# Patient Record
Sex: Female | Born: 1975 | Race: Black or African American | Hispanic: No
Health system: Western US, Academic
[De-identification: ages and names within clinical notes are randomized; demographics above are authoritative.]

## PROBLEM LIST (undated history)

## (undated) DIAGNOSIS — N901 Moderate vulvar dysplasia: Secondary | ICD-10-CM

## (undated) DIAGNOSIS — E1022 Type 1 diabetes mellitus with diabetic chronic kidney disease: Secondary | ICD-10-CM

## (undated) DIAGNOSIS — N289 Disorder of kidney and ureter, unspecified: Secondary | ICD-10-CM

## (undated) DIAGNOSIS — N939 Abnormal uterine and vaginal bleeding, unspecified: Secondary | ICD-10-CM

## (undated) DIAGNOSIS — G35 Multiple sclerosis: Secondary | ICD-10-CM

## (undated) DIAGNOSIS — I10 Essential (primary) hypertension: Secondary | ICD-10-CM

## (undated) DIAGNOSIS — E7800 Pure hypercholesterolemia, unspecified: Secondary | ICD-10-CM

## (undated) DIAGNOSIS — I509 Heart failure, unspecified: Secondary | ICD-10-CM

## (undated) DIAGNOSIS — E119 Type 2 diabetes mellitus without complications: Secondary | ICD-10-CM

## (undated) DIAGNOSIS — N189 Chronic kidney disease, unspecified: Secondary | ICD-10-CM

## (undated) DIAGNOSIS — D649 Anemia, unspecified: Secondary | ICD-10-CM

## (undated) HISTORY — DX: Abnormal uterine and vaginal bleeding, unspecified: N93.9

## (undated) HISTORY — DX: Multiple sclerosis: G35

## (undated) HISTORY — DX: Type 1 diabetes mellitus with diabetic chronic kidney disease: E10.22

## (undated) HISTORY — PX: SURGICAL HX OTHER: 99

## (undated) HISTORY — DX: Disorder of kidney and ureter, unspecified: N28.9

## (undated) HISTORY — DX: Moderate vulvar dysplasia: N90.1

---

## 2002-09-13 ENCOUNTER — Ambulatory Visit: Payer: Self-pay

## 2003-10-30 ENCOUNTER — Ambulatory Visit (EMERGENCY_DEPARTMENT_HOSPITAL): Payer: Self-pay

## 2004-03-04 ENCOUNTER — Ambulatory Visit: Payer: Self-pay

## 2004-03-06 ENCOUNTER — Ambulatory Visit: Payer: Self-pay

## 2004-03-10 ENCOUNTER — Ambulatory Visit: Payer: Self-pay

## 2004-03-19 ENCOUNTER — Encounter: Payer: Self-pay | Admitting: Family Medicine

## 2006-09-03 ENCOUNTER — Ambulatory Visit: Payer: Self-pay | Admitting: Psychiatry

## 2006-09-03 ENCOUNTER — Inpatient Hospital Stay (HOSPITAL_COMMUNITY): Admission: AD | Admit: 2006-09-03 | Discharge: 2006-09-07 | Payer: Self-pay | Admitting: Psychiatry

## 2015-04-11 HISTORY — PX: SURGICAL HX OTHER: 99

## 2016-08-19 NOTE — Progress Notes (Signed)
[place]  NEW GYNECOLOGY VISIT    ID/CHIEF COMPLAINT:  Desiree Eaton is a 40 year old G102P1 F who presents for abnormal uterine bleeding.   Patient was referred by Dr. Arta Silence.  Interpreter not needed for this visit.     HISTORY OF PRESENT ILLNESS  Review of outside records:   Initially seen by Dr. Truett Perna on 06/25/16 and reported AUB with heavy and irregular cycles. Per patient, had a normal [place] and Mirena IUD placed 05/2015 that helped with menses but still irregular and heavy. At her most recent visit on 08/11/16, she was started on Provera taper with plan for pelvic US and follow up. She has received IV iron and a recent blood transfusion. Mirena IUD in place on exam in clinic and recent GCCT negative. She desires a pregnancy again in the future.     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 Lenor Coffin. Was planning a course of Aldara post op.   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 Had not been told about the most recent [place]. No significant cramping.   [facility] noticed that last month and the month before she had this heavy, prolonged bleeding. It ultimately stopped though.   -So this is the second month that she has been bleeding continuously without  really stopping. Hasn't stopped bleeding since mid September. Feels like this was a sudden change in her bleeding starting about 2 months ago.   -Bleeding quantity: Changing pads every 3 hours (not soaked), Thinks it would take 5-6 hours to soak a pad. This is much improved with the Provera medication she started. Currently taking 1 pill (confirmed this is '10mg'$  with pill bottle today) 3x daily   -Prior to this, reports periods were normal and regular. No problems with heavy bleeding or severe cramping.  -Recent blood transfusion 2 weeks ago, she reports this was because of her dialysis and not the bleeding.   -Currently doing daily dialysis at home, has labs drawn frequently for this reason.   -Feels like it isn't safe for her to have pregnancies in the future because of her MS. Because of this, she is considering giving up the idea of a future pregnancy.     No lightheadedness or dizziness today.   Just tired of having bleeding even if it is slowing down.      Past Gyn History:   No LMP recorded.  Menarche Age: Not discussed   Menses: See above.   Sexually Active: Not currently. Last SA in March 2017. Has been with female partners.   Contraception: Mirena IUD (see above)   STIs: History of trichomonas (02/2015) Gonorrhea  and Chlamydia "awhile ago" before her son who was born 6y ago  Pap Smears: Last 02/2015 NILM HPV neg, Reports no history of abnormal paps.   Gyn surgeries: CS, WLE   Gardasil: Not discussed       OB HISTORY   2011 CS @ [facility]       ROS: Extended 2-9, Complete 10+   General: Negative    Eyes: Negative    Head, Ears, Throat: Negative    Cardiovascular: Negative    Respiratory: Negative    Breast: Negative   Gastrointestinal: Negative   Genitourinary: As noted in HPI above   Musculoskeletal: Negative    Skin: Negative    Neurological: Negative    Hematologic: As noted in HPI above   Psychiatric: Negative    Endocrine: Negative     Patient Active Problem List    Diagnosis Date Noted   . 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 Multiple sclerosis ([place]) [G35]        PAST MEDICAL HISTORY   Past Medical History:   Diagnosis Date   . Abnormal uterine bleeding    . Kidney disease    . Multiple sclerosis ([place])    . Type 1 DM with end-stage renal disease ([place])    . VIN II (vulvar intraepithelial neoplasia II)        SURGICAL HISTORY   Past Surgical History:   Procedure Laterality Date   . CESAREAN DELIVERY ONLY     . kidney dyalisis     . WLE and CO2 laser  04/2015       MEDICATIONS     Current Outpatient Prescriptions:   .  Acetaminophen 325 MG Oral Tab, Take 650 mg by mouth., Disp: , Rfl:   .  Aspirin 81 MG Oral Chew Tab, Chew and swallow 81 mg by mouth., Disp: , Rfl:   .  Interferon Beta-1b (BETASERON) 0.3 MG Subcutaneous Kit, Inject under the skin every other day., Disp: , Rfl:   .  Levonorgestrel (Mirena) 52 mg Intrauterine Device, Insert 1 Intra Uterine Device into the uterus One time., Disp: , Rfl:   .  Ondansetron 4 MG Oral TABLET DISPERSIBLE, Dissolve 4 mg on top of tongue and swallow., Disp: , Rfl:   Calcium Acetate and Calcitriol    Provera   Tizanidine     Review of patient's allergies indicates:  Allergies   Allergen Reactions   . Codeine        FAMILY HISTORY   Family History   Problem Relation Age of Onset   . No Known Problems Mother    . Breast Cancer No Hx Of    . Ovarian Cancer No Hx Of        SOCIAL HISTORY   Social History     Social History   . Marital status: Single     Spouse name: N/A   . Number of children: N/A   . Years of education: N/A     Occupational History   . Not on file.     Social History Main Topics   . Smoking status: Current Every Day Smoker   . Smokeless tobacco: Not on file   . Alcohol use No   . Drug use:      Types: Marijuana   . Sexual activity: Not Currently     Birth control/ protection: LNG IUD     Other Topics Concern   . Not  on file     Social History Narrative   . No narrative on file       Physical Exam:  1995   Detailed - 5-7 systems,  Comprehensive -8+  BP 148/86   Pulse 93   Ht '5\' 3"'$  (1.6 m)   Wt 180 lb (81.6 kg)   BMI 31.89 kg/m   General: healthy, alert, no distress.  Cardiac: Normal pulses in the lower extremities with normal capillary refill, No edema or varicosities.  Respiratory: Normal respiratory effort and chest wall movement with respiration.   Abdomen: Soft, non-tender. BS normal. No masses or organomegaly.   Psychiatric:   Mood/affect:  Normal.  Orientation: oriented to time, person and place  Neurologic:  Gait:  Normal.  Skin: Skin color, texture, turgor normal. No rashes or concerning lesions on visible areas.  Nodes: Inguinal areas: No adenopathy.  Pelvic Exam: External genitalia normal architecture, normal bartholin/skene/urethral meatus/anus, Vagina is rugated and well-estrogenized, small amount of blood in vaginal vault, cervix nulliparous and normal in appearance with minimal blood from os, tip of IUD noted at external os with long strings, IUD palpable on bimanual, no CMT, no bladder tenderness, uterus normal size, shape, and consistency, no adnexal masses or tenderness.     Bedside TVUS: Confirmed IUD in cervix.     Office Visit on 08/20/16   1. URINE PREGNANCY TEST HCG, ONSITE   Result Value Ref Range    Pregnancy (HCG) ([facility]), URN Negative     INTERNAL CONTROL Control Verified    2. 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     # Abnormal Uterine Bleeding: She describes irregular and intermittently heavy menses for approximately the past 2 months, previously fairly well controlled with Mirena IUD.  No significant cramping but outside ultrasound documented IUD within the cervix which was confirmed on bedside ultrasound today.   Reviewed that this may be the reason for abnormal bleeding and discussed replacing the IUD today while continuing her on her Provera taper which has significantly slowed her bleeding over the past 2 weeks.     No other structural causes for AUB noted on ultrasound including polyps or fibroids.  Endometrial stripe not particularly thickened and bleeding has slowed with Provera so do not think a procedure such as a D&C is warranted at this time. She does have a slightly enlarged uterus which may be a component of adenomyosis.  We also discussed the possibility of precancer or cancer and chronic anovulation given her multiple medical comorbidities.  Her recent prolactin and thyroid testing was normal outside as above.     -Continue the Provera taper with 10 mg 3 times a day.  Discussed that she will likely need this through the first several months of the Mirena IUD.  -Mirena IUD removed and replaced today due to malposition.    -EMB done today to rule out hyperplasia or neoplasia   -Deferred lab work given her recent testing as above and reported nearly daily blood work given she is on dialysis   -Also reviewed surgical options for management given that she is contemplating future fertility.  This may include endometrial ablation versus  hysterectomy although suspect that she is currently not well enough to undergo surgery if bleeding can be controlled with medical management.     # History of VIN2: Patient did not recall WLE or a history of VIN2 during our visit. Records seen in Care Everywhere. Will need vulvar exams with colposcopy when AUB has been resolved.     # Routine GYN:   -Paps: UTD  -STI screening: GCCT done today     Note forwarded to referring provider.         PROCEDURE NOTE: ENDOMETRIAL BIOPSY & INTRAUTERINE DEVICE INSERTION    A visit for counseling and discussion of this procedure took place today. Testing for chlamydia and gonorrhea was done today and result was pending. Pap smear was not done. A  urine pregnancy test was done and the result was negative.     Mirena  Lot number: [id number]  Exp 04/20    Indication:  Desire for contraception and Abnormal Uterine Bleeding   Allergic to anesthetic, latex, iodine: NO    Review of contraindications:    Known or suspected pregnancy: NO    Distorted uterine cavity (cannot accommodate an IUD): NO    Current breast cancer (Levonorgestrel IUD only): NO     Cervical cancer, awaiting treatment: NO    Endometrial cancer: NO    Current pelvic infection: NO    Unexplained abnormal uterine bleeding: NO    History of Wilson's disease (Copper IUD only): NO   In compliance with federal regulations, the patient was given the informational brochure that accompanies the IUD: YES  The risks and benefits of use of the IUD were discussed with the patient: YES    Risks include, but are not limited to:   1. The remote chance of contraceptive failure   2. Increased risk of ectopic pregnancy, infection, and/or miscarriage if pregnancy occurs   3. Risk of PID if exposed to sexually transmitted diseases   4. Risk of heavier periods and dysmenorrhea (Copper IUD only)    5. Risk of irregular periods and amenorrhea (Levonorgestrel IUD only)    6. Risk of embedment of the IUD into the endometrium and consequent difficulty in removal   7. Risk of uterine perforation, possible damage to intraabdominal organs and/or need for surgical intervention.       Correct patient NAME and ID YES   Correct PROCEDURE YES   Correct EQUIPMENT and SETTINGS YES   Completed CONSENT YES, Date: 08/20/2016     The patient was placed in the dorsal lithotomy position.  Bimanual exam showed the uterus to be in the neutral position and mildly enlarged.  Speculum placed in vagina with findings as above and malpositioned IUD visualized at the external cervical os.  The IUD was grasped and removed in its entirety.  The removal was slightly difficult suggesting a possible embedment of the IUD arm(s).  The cervix was then  cleansed with Betadine x3. A single tooth tenaculum was applied to the anterior lip of the cervix. Endometrial biopsy pipelle introduced into uterus to depth of 10cm and specimen obtained in single pass. Scant EBL. Patient tolerated that portion of the procedure with minimal discomfort.  A Mirena intrauterine device was inserted to the fundus using the prepackaged inserter.  The IUD strings were trimmed to a length of 3 cm from the cervical os.    The patient tolerated the procedure well.  Complications: none  Procedure performed by Attending physician    Postprocedure education was performed, including  the following points:  1. You can manually check your IUD strings and were taught how to do this, but this is not required.   2. If you are concerned you are pregnant, call your doctor.   3. Seek immediate care for signs of infection, including pelvic pain, vaginal discharge or excessive or intermenstrual bleeding, and fever.   4. If you have heavy cramping and are concerned that your IUD has been expelled, please call your provider.   5. The MIRENA  IUD should be removed 5-7 years after insertion (past 5 years discussed with patient that it was off-label use).   6. It is still necessary to return for periodic check-ups, pap smears, etc at a time interval recommended by your provider.    Patient instructed to return for follow-up after next menses (4-6 weeks).  She may choose to follow-up closer to home given [place].

## 2016-08-20 ENCOUNTER — Encounter (HOSPITAL_BASED_OUTPATIENT_CLINIC_OR_DEPARTMENT_OTHER): Payer: Self-pay | Admitting: Obstetrics & Gynecology

## 2016-08-20 ENCOUNTER — Ambulatory Visit: Payer: Medicare Other | Attending: Obstetrics & Gynecology | Admitting: Obstetrics & Gynecology

## 2016-08-20 VITALS — BP 148/86 | HR 93 | Ht 63.0 in | Wt 180.0 lb

## 2016-08-20 DIAGNOSIS — T8332XA Displacement of intrauterine contraceptive device, initial encounter: Secondary | ICD-10-CM | POA: Insufficient documentation

## 2016-08-20 DIAGNOSIS — N939 Abnormal uterine and vaginal bleeding, unspecified: Secondary | ICD-10-CM

## 2016-08-20 DIAGNOSIS — Z6831 Body mass index (BMI) 31.0-31.9, adult: Secondary | ICD-10-CM

## 2016-08-20 DIAGNOSIS — Z30431 Encounter for routine checking of intrauterine contraceptive device: Secondary | ICD-10-CM | POA: Insufficient documentation

## 2016-08-20 DIAGNOSIS — G35 Multiple sclerosis: Secondary | ICD-10-CM | POA: Insufficient documentation

## 2016-08-20 DIAGNOSIS — Z30430 Encounter for insertion of intrauterine contraceptive device: Secondary | ICD-10-CM

## 2016-08-20 DIAGNOSIS — N901 Moderate vulvar dysplasia: Secondary | ICD-10-CM | POA: Insufficient documentation

## 2016-08-20 DIAGNOSIS — N186 End stage renal disease: Secondary | ICD-10-CM | POA: Insufficient documentation

## 2016-08-20 DIAGNOSIS — E1022 Type 1 diabetes mellitus with diabetic chronic kidney disease: Secondary | ICD-10-CM | POA: Insufficient documentation

## 2016-08-20 LAB — PR URINE PREGNANCY TEST HCG, ONSITE: Pregnancy (HCG) (UWNC), URN: NEGATIVE

## 2016-08-20 NOTE — Patient Instructions (Signed)
After Your IUD Is Placed     What can I expect?  . It is normal to have unusual bleeding, spotting, and mild cramping for 3 to 6 months after your IUD is placed. These usually improve over time. If your bleeding or cramping is not getting better, or if you have heavy bleeding or strong pelvic pain, call our office right away.   . Ibuprofen (Advil, Motrin, and others) helps decrease pain and cramping. You can buy ibuprofen at any drugstore without a prescription. Take 3 tablets (200 mg each) by mouth every 6 hours as needed for pain and cramping. Do not take more than 12 tablets in 24 hours. Be sure to take ibuprofen with food.    What problems should I watch for?   . The IUD can sometimes fall out (be expelled from the uterus).   ? If you have heavy bleeding or pain, check with your fingers to make sure you can still feel the IUD strings. Do not pull on the strings, since you could remove the IUD. If you cannot feel the strings, call our office.   ? If you are not having symptoms that concern you, you do not need to check your IUD strings.   . The IUD is a very good form of birth control, but no form of birth control is perfect. If you have nausea, breast tenderness, pelvic pain, or unexpected bleeding, you may be pregnant. If you have these signs, call our office.  . Women who have an IUD can get pelvic infections. Call us right away if you have pain in your pelvis or lower abdomen, unusual vaginal discharge, or a fever higher than 101F (38.3C), if it is not caused by another illness.   . See your healthcare provider for yearly exams, and have all routine screening tests. An IUD does not protect against sexually transmitted infections.     When should the IUD be removed?  An IUD can be removed at any time. IUDs are approved by the Food and Drug Administration (FDA) for up to a certain number of years, but in some cases the IUD can be used for a longer period of time. It is important that you talk about this  with your healthcare provider.   Here are the number of years each IUD can be used:  . Mirena IUD: 5 to 7 years after it is placed (approved by the FDA for 5 years)  . Paragard IUD: 10 to 12 years after it is placed (approved by the FDA for 10 years)  . Skyla IUD: 3 years after it is placed (approved by the FDA for 3 years)  . Liletta IUD: 3 to 7 years after it is placed (approved by the FDA for 3 years)    When should I call my healthcare provider?  Call your healthcare provider if you:  . Have heavy bleeding  . Feel strong or sharp pain in your pelvis or lower abdomen  . Have vaginal discharge that smells bad  . Feel pain when you have sex  . Cannot find the IUD strings  . Have a fever above 101F (38.3C) that you cannot explain  . Think you might be pregnant  . Want to have the IUD removed    Questions?  . Keep these instructions so you can refer to them as needed.  . Please call your healthcare provider if you have any questions or concerns about your IUD.

## 2016-08-21 LAB — GC&CHLAM NUCLEIC ACID DETECTN
Chlam Trachomatis Nucleic Acid: NEGATIVE
N.Gonorrhoeae(GC) Nucleic Acid: NEGATIVE

## 2016-08-22 ENCOUNTER — Telehealth (HOSPITAL_BASED_OUTPATIENT_CLINIC_OR_DEPARTMENT_OTHER): Payer: Self-pay | Admitting: Obstetrics & Gynecology

## 2016-08-22 LAB — PATHOLOGY, SURGICAL

## 2016-08-22 NOTE — Telephone Encounter (Signed)
Asked by Dr. Durenda AgeAndersen to call patient:    Idalia NeedleAndersen, Melanie L, MD  [facility]            Please call Desiree Eaton (goes by TT) and update her that the endometrial biopsy was normal without any concerns for abnormal growths or cancerous tissue.      Called patient on her cell phone and got voice mail-left a non detailed message that her results are back on her testing and it was all normal and no worries-asked her to call me back to discuss or if she has questions.

## 2016-09-03 ENCOUNTER — Telehealth (HOSPITAL_BASED_OUTPATIENT_CLINIC_OR_DEPARTMENT_OTHER): Payer: Self-pay | Admitting: Obstetrics & Gynecology

## 2016-09-03 NOTE — Telephone Encounter (Signed)
(  TEXTING IS AN OPTION FOR [facility] CLINICS ONLY)  Is this a [facility] clinic? No      RETURN CALL: Detailed message on voicemail only      SUBJECT:  General Message     REASON FOR REQUEST: Megan states [place], Barrie Folk, MD is seeing patient right now and is requesting a copy of patients results from the endometrial biopsy patient had on 08/20/16. Please fax information to [phone]     MESSAGE:

## 2016-09-03 NOTE — Telephone Encounter (Signed)
Fwd to RN pool.

## 2016-09-03 NOTE — Telephone Encounter (Signed)
RN faxed Pathology results to Dr Harvie HeckHallak at  [phone].

## 2018-01-17 ENCOUNTER — Inpatient Hospital Stay: Payer: Self-pay

## 2018-03-09 ENCOUNTER — Inpatient Hospital Stay: Payer: Self-pay

## 2018-04-21 ENCOUNTER — Inpatient Hospital Stay: Payer: Self-pay

## 2018-04-30 ENCOUNTER — Inpatient Hospital Stay: Payer: Self-pay

## 2018-05-18 ENCOUNTER — Inpatient Hospital Stay: Payer: Self-pay

## 2018-07-15 ENCOUNTER — Inpatient Hospital Stay: Payer: Self-pay

## 2018-08-14 ENCOUNTER — Inpatient Hospital Stay: Payer: Self-pay

## 2018-09-28 ENCOUNTER — Inpatient Hospital Stay: Payer: Self-pay

## 2018-11-28 ENCOUNTER — Inpatient Hospital Stay: Payer: Self-pay

## 2019-09-02 DIAGNOSIS — N183 Chronic kidney disease, stage 3 unspecified: Secondary | ICD-10-CM

## 2019-09-02 DIAGNOSIS — M869 Osteomyelitis, unspecified: Secondary | ICD-10-CM

## 2019-09-02 DIAGNOSIS — N189 Chronic kidney disease, unspecified: Secondary | ICD-10-CM | POA: Diagnosis not present

## 2019-09-02 DIAGNOSIS — J9601 Acute respiratory failure with hypoxia: Secondary | ICD-10-CM | POA: Diagnosis not present

## 2019-09-02 DIAGNOSIS — I10 Essential (primary) hypertension: Secondary | ICD-10-CM | POA: Diagnosis not present

## 2019-09-02 DIAGNOSIS — I361 Nonrheumatic tricuspid (valve) insufficiency: Secondary | ICD-10-CM | POA: Diagnosis not present

## 2019-09-02 DIAGNOSIS — I509 Heart failure, unspecified: Secondary | ICD-10-CM

## 2019-09-03 ENCOUNTER — Inpatient Hospital Stay (HOSPITAL_COMMUNITY)
Admission: AD | Admit: 2019-09-03 | Discharge: 2019-09-07 | DRG: 291 | Disposition: A | Discharge status: Skilled Nursing Facility | Payer: Medicaid Other | Source: Other Acute Inpatient Hospital | Attending: Internal Medicine | Admitting: Internal Medicine

## 2019-09-03 ENCOUNTER — Inpatient Hospital Stay (HOSPITAL_COMMUNITY): Payer: Medicaid Other

## 2019-09-03 DIAGNOSIS — J9600 Acute respiratory failure, unspecified whether with hypoxia or hypercapnia: Secondary | ICD-10-CM | POA: Diagnosis present

## 2019-09-03 DIAGNOSIS — J9601 Acute respiratory failure with hypoxia: Secondary | ICD-10-CM | POA: Diagnosis present

## 2019-09-03 DIAGNOSIS — N179 Acute kidney failure, unspecified: Secondary | ICD-10-CM | POA: Diagnosis not present

## 2019-09-03 DIAGNOSIS — Z87891 Personal history of nicotine dependence: Secondary | ICD-10-CM

## 2019-09-03 DIAGNOSIS — E113599 Type 2 diabetes mellitus with proliferative diabetic retinopathy without macular edema, unspecified eye: Secondary | ICD-10-CM | POA: Diagnosis not present

## 2019-09-03 DIAGNOSIS — N183 Chronic kidney disease, stage 3 unspecified: Secondary | ICD-10-CM

## 2019-09-03 DIAGNOSIS — Z89412 Acquired absence of left great toe: Secondary | ICD-10-CM | POA: Diagnosis not present

## 2019-09-03 DIAGNOSIS — E1142 Type 2 diabetes mellitus with diabetic polyneuropathy: Secondary | ICD-10-CM | POA: Diagnosis present

## 2019-09-03 DIAGNOSIS — M869 Osteomyelitis, unspecified: Secondary | ICD-10-CM | POA: Diagnosis not present

## 2019-09-03 DIAGNOSIS — Z881 Allergy status to other antibiotic agents status: Secondary | ICD-10-CM | POA: Diagnosis not present

## 2019-09-03 DIAGNOSIS — Z9089 Acquired absence of other organs: Secondary | ICD-10-CM

## 2019-09-03 DIAGNOSIS — Z98891 History of uterine scar from previous surgery: Secondary | ICD-10-CM | POA: Diagnosis not present

## 2019-09-03 DIAGNOSIS — Z89411 Acquired absence of right great toe: Secondary | ICD-10-CM | POA: Diagnosis not present

## 2019-09-03 DIAGNOSIS — Z841 Family history of disorders of kidney and ureter: Secondary | ICD-10-CM

## 2019-09-03 DIAGNOSIS — Z8739 Personal history of other diseases of the musculoskeletal system and connective tissue: Secondary | ICD-10-CM

## 2019-09-03 DIAGNOSIS — L97429 Non-pressure chronic ulcer of left heel and midfoot with unspecified severity: Secondary | ICD-10-CM | POA: Diagnosis not present

## 2019-09-03 DIAGNOSIS — Z833 Family history of diabetes mellitus: Secondary | ICD-10-CM

## 2019-09-03 DIAGNOSIS — R0602 Shortness of breath: Secondary | ICD-10-CM | POA: Diagnosis present

## 2019-09-03 DIAGNOSIS — Z89422 Acquired absence of other left toe(s): Secondary | ICD-10-CM

## 2019-09-03 DIAGNOSIS — Z888 Allergy status to other drugs, medicaments and biological substances status: Secondary | ICD-10-CM

## 2019-09-03 DIAGNOSIS — K5900 Constipation, unspecified: Secondary | ICD-10-CM | POA: Diagnosis present

## 2019-09-03 DIAGNOSIS — I5033 Acute on chronic diastolic (congestive) heart failure: Secondary | ICD-10-CM | POA: Diagnosis not present

## 2019-09-03 DIAGNOSIS — E1122 Type 2 diabetes mellitus with diabetic chronic kidney disease: Secondary | ICD-10-CM | POA: Diagnosis present

## 2019-09-03 DIAGNOSIS — E1169 Type 2 diabetes mellitus with other specified complication: Secondary | ICD-10-CM | POA: Diagnosis not present

## 2019-09-03 DIAGNOSIS — E785 Hyperlipidemia, unspecified: Secondary | ICD-10-CM | POA: Diagnosis present

## 2019-09-03 DIAGNOSIS — N189 Chronic kidney disease, unspecified: Secondary | ICD-10-CM

## 2019-09-03 DIAGNOSIS — E11621 Type 2 diabetes mellitus with foot ulcer: Principal | ICD-10-CM | POA: Diagnosis present

## 2019-09-03 DIAGNOSIS — Z87442 Personal history of urinary calculi: Secondary | ICD-10-CM | POA: Diagnosis not present

## 2019-09-03 DIAGNOSIS — Z882 Allergy status to sulfonamides status: Secondary | ICD-10-CM

## 2019-09-03 DIAGNOSIS — Z20828 Contact with and (suspected) exposure to other viral communicable diseases: Secondary | ICD-10-CM | POA: Diagnosis present

## 2019-09-03 DIAGNOSIS — I10 Essential (primary) hypertension: Secondary | ICD-10-CM | POA: Diagnosis not present

## 2019-09-03 DIAGNOSIS — B952 Enterococcus as the cause of diseases classified elsewhere: Secondary | ICD-10-CM | POA: Diagnosis not present

## 2019-09-03 DIAGNOSIS — Z79899 Other long term (current) drug therapy: Secondary | ICD-10-CM

## 2019-09-03 DIAGNOSIS — I330 Acute and subacute infective endocarditis: Secondary | ICD-10-CM

## 2019-09-03 DIAGNOSIS — D631 Anemia in chronic kidney disease: Secondary | ICD-10-CM | POA: Diagnosis not present

## 2019-09-03 DIAGNOSIS — Z794 Long term (current) use of insulin: Secondary | ICD-10-CM

## 2019-09-03 DIAGNOSIS — Z95828 Presence of other vascular implants and grafts: Secondary | ICD-10-CM

## 2019-09-03 DIAGNOSIS — Z8249 Family history of ischemic heart disease and other diseases of the circulatory system: Secondary | ICD-10-CM

## 2019-09-03 DIAGNOSIS — R7881 Bacteremia: Secondary | ICD-10-CM | POA: Diagnosis present

## 2019-09-03 DIAGNOSIS — B9561 Methicillin susceptible Staphylococcus aureus infection as the cause of diseases classified elsewhere: Secondary | ICD-10-CM | POA: Diagnosis not present

## 2019-09-03 DIAGNOSIS — I130 Hypertensive heart and chronic kidney disease with heart failure and stage 1 through stage 4 chronic kidney disease, or unspecified chronic kidney disease: Principal | ICD-10-CM | POA: Diagnosis present

## 2019-09-03 DIAGNOSIS — B9689 Other specified bacterial agents as the cause of diseases classified elsewhere: Secondary | ICD-10-CM | POA: Diagnosis not present

## 2019-09-03 DIAGNOSIS — I5031 Acute diastolic (congestive) heart failure: Secondary | ICD-10-CM

## 2019-09-03 DIAGNOSIS — Z8619 Personal history of other infectious and parasitic diseases: Secondary | ICD-10-CM

## 2019-09-03 DIAGNOSIS — I5030 Unspecified diastolic (congestive) heart failure: Secondary | ICD-10-CM | POA: Diagnosis not present

## 2019-09-03 DIAGNOSIS — I38 Endocarditis, valve unspecified: Secondary | ICD-10-CM | POA: Diagnosis present

## 2019-09-03 DIAGNOSIS — L97529 Non-pressure chronic ulcer of other part of left foot with unspecified severity: Secondary | ICD-10-CM | POA: Diagnosis not present

## 2019-09-03 HISTORY — DX: Pure hypercholesterolemia, unspecified: E78.00

## 2019-09-03 HISTORY — DX: Chronic kidney disease, unspecified: N18.9

## 2019-09-03 HISTORY — DX: Essential (primary) hypertension: I10

## 2019-09-03 HISTORY — DX: Heart failure, unspecified: I50.9

## 2019-09-03 HISTORY — DX: Type 2 diabetes mellitus without complications: E11.9

## 2019-09-03 HISTORY — DX: Anemia, unspecified: D64.9

## 2019-09-03 LAB — CBC
HCT: 23.9 % — ABNORMAL LOW (ref 36.0–46.0)
Hemoglobin: 7.8 g/dL — ABNORMAL LOW (ref 12.0–15.0)
MCH: 26.7 pg (ref 26.0–34.0)
MCHC: 32.6 g/dL (ref 30.0–36.0)
MCV: 81.8 fL (ref 80.0–100.0)
Platelets: 282 10*3/uL (ref 150–400)
RBC: 2.92 MIL/uL — ABNORMAL LOW (ref 3.87–5.11)
RDW: 17.8 % — ABNORMAL HIGH (ref 11.5–15.5)
WBC: 14.1 10*3/uL — ABNORMAL HIGH (ref 4.0–10.5)
nRBC: 0 % (ref 0.0–0.2)

## 2019-09-03 LAB — COMPREHENSIVE METABOLIC PANEL
ALT: 13 U/L (ref 0–44)
AST: 16 U/L (ref 15–41)
Albumin: 2.7 g/dL — ABNORMAL LOW (ref 3.5–5.0)
Alkaline Phosphatase: 84 U/L (ref 38–126)
Anion gap: 12 (ref 5–15)
BUN: 38 mg/dL — ABNORMAL HIGH (ref 6–20)
CO2: 19 mmol/L — ABNORMAL LOW (ref 22–32)
Calcium: 8.5 mg/dL — ABNORMAL LOW (ref 8.9–10.3)
Chloride: 102 mmol/L (ref 98–111)
Creatinine, Ser: 2.5 mg/dL — ABNORMAL HIGH (ref 0.44–1.00)
GFR calc Af Amer: 27 mL/min — ABNORMAL LOW (ref >60)
GFR calc non Af Amer: 23 mL/min — ABNORMAL LOW (ref >60)
Glucose, Bld: 121 mg/dL — ABNORMAL HIGH (ref 70–99)
Potassium: 4.5 mmol/L (ref 3.5–5.1)
Sodium: 133 mmol/L — ABNORMAL LOW (ref 135–145)
Total Bilirubin: 0.4 mg/dL (ref 0.3–1.2)
Total Protein: 7.1 g/dL (ref 6.5–8.1)

## 2019-09-03 LAB — HEMOGLOBIN A1C
Hgb A1c MFr Bld: 7.3 % — ABNORMAL HIGH (ref 4.8–5.6)
Mean Plasma Glucose: 162.81 mg/dL

## 2019-09-03 LAB — GLUCOSE, CAPILLARY
Glucose-Capillary: 117 mg/dL — ABNORMAL HIGH (ref 70–99)
Glucose-Capillary: 177 mg/dL — ABNORMAL HIGH (ref 70–99)

## 2019-09-03 LAB — HIV ANTIBODY (ROUTINE TESTING W REFLEX): HIV Screen 4th Generation wRfx: NONREACTIVE

## 2019-09-03 MED ORDER — SODIUM CHLORIDE 0.9% FLUSH: 3.0000 mL | INTRAVENOUS | Status: DC | PRN

## 2019-09-03 MED ORDER — INSULIN ASPART 100 UNIT/ML ~~LOC~~ SOLN
0.0000 [IU] | Freq: Three times a day (TID) | SUBCUTANEOUS | Status: DC
  Administered 2019-09-04 (×2): 1 [IU] via SUBCUTANEOUS
  Administered 2019-09-05: 16:00:00 2 [IU] via SUBCUTANEOUS
  Administered 2019-09-06: 1 [IU] via SUBCUTANEOUS
  Administered 2019-09-06 – 2019-09-07 (×3): 2 [IU] via SUBCUTANEOUS

## 2019-09-03 MED ORDER — ONDANSETRON HCL 4 MG PO TABS: 4.0000 mg | ORAL_TABLET | Freq: Four times a day (QID) | ORAL | Status: DC | PRN

## 2019-09-03 MED ORDER — SODIUM CHLORIDE 0.9% FLUSH
10.0000 mL | Freq: Two times a day (BID) | INTRAVENOUS | Status: DC
  Administered 2019-09-04 – 2019-09-06 (×3): 10 mL

## 2019-09-03 MED ORDER — MAGNESIUM HYDROXIDE 400 MG/5ML PO SUSP: 30.0000 mL | Freq: Every day | ORAL | Status: DC | PRN

## 2019-09-03 MED ORDER — CARVEDILOL 25 MG PO TABS: 25.0000 mg | ORAL_TABLET | Freq: Two times a day (BID) | ORAL | Status: DC

## 2019-09-03 MED ORDER — INSULIN GLARGINE 100 UNIT/ML ~~LOC~~ SOLN
15.0000 [IU] | Freq: Every day | SUBCUTANEOUS | Status: DC
  Administered 2019-09-03 – 2019-09-06 (×4): 15 [IU] via SUBCUTANEOUS
  Filled 2019-09-03 (×5): qty 0.15

## 2019-09-03 MED ORDER — GABAPENTIN 400 MG PO CAPS
800.0000 mg | ORAL_CAPSULE | Freq: Three times a day (TID) | ORAL | Status: DC
  Administered 2019-09-03 – 2019-09-07 (×12): 800 mg via ORAL
  Filled 2019-09-03 (×12): qty 2

## 2019-09-03 MED ORDER — FERROUS SULFATE 325 (65 FE) MG PO TABS
325.0000 mg | ORAL_TABLET | Freq: Two times a day (BID) | ORAL | Status: DC
  Administered 2019-09-04 – 2019-09-07 (×8): 325 mg via ORAL
  Filled 2019-09-03 (×9): qty 1

## 2019-09-03 MED ORDER — DOCUSATE SODIUM 100 MG PO CAPS
100.0000 mg | ORAL_CAPSULE | Freq: Two times a day (BID) | ORAL | Status: DC
  Administered 2019-09-03 – 2019-09-07 (×8): 100 mg via ORAL
  Filled 2019-09-03 (×8): qty 1

## 2019-09-03 MED ORDER — PANTOPRAZOLE SODIUM 40 MG PO TBEC
40.0000 mg | DELAYED_RELEASE_TABLET | Freq: Two times a day (BID) | ORAL | Status: DC
  Administered 2019-09-03 – 2019-09-07 (×8): 40 mg via ORAL
  Filled 2019-09-03 (×8): qty 1

## 2019-09-03 MED ORDER — ASPIRIN EC 81 MG PO TBEC
81.0000 mg | DELAYED_RELEASE_TABLET | Freq: Every day | ORAL | Status: DC
  Administered 2019-09-03 – 2019-09-07 (×5): 81 mg via ORAL
  Filled 2019-09-03 (×4): qty 1

## 2019-09-03 MED ORDER — VANCOMYCIN VARIABLE DOSE PER UNSTABLE RENAL FUNCTION (PHARMACIST DOSING): Status: DC

## 2019-09-03 MED ORDER — PIPERACILLIN-TAZOBACTAM 3.375 G IVPB
3.3750 g | Freq: Three times a day (TID) | INTRAVENOUS | Status: DC
  Administered 2019-09-04 (×2): 3.375 g via INTRAVENOUS
  Filled 2019-09-03 (×3): qty 50

## 2019-09-03 MED ORDER — SENNA 8.6 MG PO TABS
1.0000 | ORAL_TABLET | Freq: Two times a day (BID) | ORAL | Status: DC
  Administered 2019-09-03 – 2019-09-07 (×8): 8.6 mg via ORAL
  Filled 2019-09-03 (×9): qty 1

## 2019-09-03 MED ORDER — ONDANSETRON HCL 4 MG/2ML IJ SOLN: 4.0000 mg | Freq: Four times a day (QID) | INTRAMUSCULAR | Status: DC | PRN

## 2019-09-03 MED ORDER — SODIUM CHLORIDE 0.9 % IV SOLN
250.0000 mL | INTRAVENOUS | Status: DC | PRN
  Administered 2019-09-05: 22:00:00 250 mL via INTRAVENOUS

## 2019-09-03 MED ORDER — PIPERACILLIN-TAZOBACTAM 3.375 G IVPB 30 MIN
3.3750 g | Freq: Once | INTRAVENOUS | Status: AC
  Administered 2019-09-03: 23:00:00 3.375 g via INTRAVENOUS
  Filled 2019-09-03: qty 50

## 2019-09-03 MED ORDER — AMLODIPINE BESYLATE 10 MG PO TABS
10.0000 mg | ORAL_TABLET | Freq: Every day | ORAL | Status: DC
  Administered 2019-09-04 – 2019-09-07 (×4): 10 mg via ORAL
  Filled 2019-09-03 (×4): qty 1

## 2019-09-03 MED ORDER — SODIUM CHLORIDE 0.9% FLUSH: 10.0000 mL | INTRAVENOUS | Status: DC | PRN

## 2019-09-03 MED ORDER — FUROSEMIDE 10 MG/ML IJ SOLN: 40.0000 mg | Freq: Every day | INTRAMUSCULAR | Status: DC

## 2019-09-03 MED ORDER — VANCOMYCIN HCL 10 G IV SOLR
1750.0000 mg | Freq: Once | INTRAVENOUS | Status: DC
  Administered 2019-09-03: 1750 mg via INTRAVENOUS
  Filled 2019-09-03: qty 1750

## 2019-09-03 MED ORDER — SODIUM CHLORIDE 0.9% FLUSH
3.0000 mL | Freq: Two times a day (BID) | INTRAVENOUS | Status: DC
  Administered 2019-09-04 – 2019-09-06 (×3): 3 mL via INTRAVENOUS

## 2019-09-03 MED ORDER — OXYCODONE HCL 5 MG PO TABS
5.0000 mg | ORAL_TABLET | Freq: Four times a day (QID) | ORAL | Status: DC | PRN
  Administered 2019-09-03 – 2019-09-07 (×12): 5 mg via ORAL
  Filled 2019-09-03 (×12): qty 1

## 2019-09-03 MED ORDER — CHLORHEXIDINE GLUCONATE CLOTH 2 % EX PADS
6.0000 | MEDICATED_PAD | Freq: Every day | CUTANEOUS | Status: DC
  Administered 2019-09-03 – 2019-09-07 (×4): 6 via TOPICAL

## 2019-09-03 MED ORDER — ACETAMINOPHEN 325 MG PO TABS: 650.0000 mg | ORAL_TABLET | Freq: Four times a day (QID) | ORAL | Status: DC | PRN

## 2019-09-03 MED ORDER — HEPARIN SODIUM (PORCINE) 5000 UNIT/ML IJ SOLN
5000.0000 [IU] | Freq: Three times a day (TID) | INTRAMUSCULAR | Status: DC
  Administered 2019-09-03 – 2019-09-07 (×12): 5000 [IU] via SUBCUTANEOUS
  Filled 2019-09-03 (×13): qty 1

## 2019-09-03 MED ORDER — ACETAMINOPHEN 650 MG RE SUPP: 650.0000 mg | Freq: Four times a day (QID) | RECTAL | Status: DC | PRN

## 2019-09-03 MED ORDER — CARVEDILOL 12.5 MG PO TABS
12.5000 mg | ORAL_TABLET | Freq: Two times a day (BID) | ORAL | Status: DC
  Filled 2019-09-03 (×4): qty 1

## 2019-09-03 MED ORDER — SORBITOL 70 % SOLN
30.0000 mL | Freq: Once | Status: AC
  Administered 2019-09-03: 30 mL via ORAL
  Filled 2019-09-03: qty 30

## 2019-09-03 NOTE — Progress Notes (Addendum)
Pharmacy Antibiotic Note  Shawna Martinez is a 43 y.o. female admitted on 09/03/2019 with a wound infection.   Pharmacy has been consulted for vancomycin and zosyn dosing. -SCr= 2.5 (range was 1.4-2 in 05/2019), CrCl ~ 30 -She received 1500mg  IV vancomycin at [facility] on 10/23 at 9pm  Plan: -Zosyn 3.375gm IV q8h -Will determine vancomycin dose based on SCr trends -Will follow renal function, cultures and clinical progress    Height: 5\' 4"  (162.6 cm) Weight: 188 lb 8 oz (85.5 kg) IBW/kg (Calculated) : 54.7  Temp (24hrs), Avg:98.1 F (36.7 C), Min:98.1 F (36.7 C), Max:98.1 F (36.7 C)  No results for input(s): WBC, CREATININE, LATICACIDVEN, VANCOTROUGH, VANCOPEAK, VANCORANDOM, GENTTROUGH, GENTPEAK, GENTRANDOM, TOBRATROUGH, TOBRAPEAK, TOBRARND, AMIKACINPEAK, AMIKACINTROU, AMIKACIN in the last 168 hours.  CrCl cannot be calculated (No successful lab value found.).    Allergies  Allergen Reactions  . Statins     Affect motors skills.   . Sulfa Antibiotics     Nausea,  Vomiting.     Antimicrobials this admission: 10/24 vanc 10/24 zosyn  Dose adjustments this admission:   Microbiology results: 10/24 blood x2  Thank you for allowing pharmacy to be a part of this patient's care.  Hildred Laser, PharmD Clinical Pharmacist **Pharmacist phone directory can now be found on [email or web address] ([email or web address]).  Listed under [place].

## 2019-09-03 NOTE — H&P (Addendum)
History and Physical  Shanley Furlough UXL:[id number] DOB: 05/19/1976 DOA: 09/03/2019  PCP: Bonnita Nasuti, MD Patient coming from: Transferred from [facility]  I have personally briefly reviewed patient's old medical records in [place]   Chief Complaint: Shortness of breath  HPI: Shawna Martinez is a 43 y.o. female with past medical history significant for diabetes type 2 insulin-dependent, diabetic neuropathy, hypertension, diastolic heart failure, stage III chronic kidney disease prior creatinine levels 1.5--- 1.7 (05-2019), prior history of osteomyelitis of the right ankle and foot and recently underwent right great toe amputation  ( on October 17), patient was recently hospitalized at [facility] for surgery and discharged to skilled nursing facility on 10/21.  She was discharged on IV Zosyn to treat underlying osteomyelitis with recommendation to complete 4 weeks of IV antibiotics.  Patient  Home dose lasix was held at discharge due to AKI on chronic kidney disease.  Patient presented to [facility] emergency department on 09/02/2019 with worsening shortness of breath and acute hypoxic respiratory failure requiring 4 L of oxygen.  Chest x-ray show pulmonary edema or multifocal pneumonia with possible small left pleural effusion.  CTA was negative for PE finding concerning with heart failure versus pneumonia.  During evaluation for heart failure exacerbation patient had 2D echo performed that showed preserved ejection fraction of 70%, possible small vegetation on the cusp  of the aortic valve suggestive of endocarditis.  Case was discussed with cardiology who recommended patient to be transferred to [facility] con for definitive study, TEE.  Patient was treated with IV Lasix 40 mg IV twice daily, with improvement of symptoms.  IV Zosyn and IV vancomycin for presumed endocarditis.  Patient on my evaluation today is alert and oriented x3.  She does not appear to be in any acute  respiratory distress.  She report that she is breathing better.  She denies chest pain currently.  She report  a prior episode of chest pain, during her hospitalization at [facility].  She is complaining of reflux-like symptoms.  She has not had a good bowel movement in more than 2 weeks.  She denies abdominal pain, she is passing flatus. Labs from [facility]: Hemoglobin 7.1 platelets 244, white blood cell 10.1.  Sodium 130, potassium 4.3, chloride 102, BUN 33, creatinine 1.8, glucose 204.   Review of Systems: All systems reviewed and apart from history of presenting illness, are negative.  Past medical history: Diabetes, insulin-dependent. Hypertension Diabetic neuropathy. Retinopathy Chronic kidney disease a stage III Anemia of chronic disease. Diabetic ulcer Osteomyelitis of the right great toe status post amputation.  Past surgical history:;  Tonsillectomy and adenoidectomy. C-section. Toes amputation of the left foot. Great toe amputation of the right foot  Social History: Denies history   alcohol, and drug.    Family history: Diabetes, hypertension, chronic kidney disease requiring dialysis.  Prior to Admission medications   Not on File   Physical Exam: Vitals:   09/03/19 1500 09/03/19 1517  BP: (!) 155/79 (!) 155/79  Pulse: (!) 58 (!) 56  Resp: 18 18  Temp: 98.1 F (36.7 C) 98.1 F (36.7 C)  TempSrc: Oral Oral  SpO2: 100% 100%  Weight: 85.5 kg   Height: _0  (1.626 m)      General exam: Moderately built and nourished patient, lying comfortably supine on the gurney in no obvious distress.  Head, eyes and ENT: Nontraumatic and normocephalic. Pupils equally reacting to light and accommodation. Oral mucosa moist.  Neck:  Supple. No JVD, carotid bruit or thyromegaly.  Lymphatics: No lymphadenopathy.  Respiratory system: Bilateral crackles at the bases. No increased work of breathing.  Cardiovascular system: S1 and S2 heard, RRR. No JVD,  positive murmurs, gallops, clicks or pedal edema.  Gastrointestinal system: Abdomen is nondistended, soft and nontender. Normal bowel sounds heard. No organomegaly or masses appreciated.  Central nervous system: Alert and oriented. No focal neurological deficits.  Extremities: Symmetric 5 x 5 power.  peripheral pulses symmetrically felt.   Skin: No rashes or acute findings.  Musculoskeletal system: Right foot, status post great toe amputation, incision with sutures looks clean, no evidence of infection.  Left foot status post amputation of the great toe and second toe, ulceration plantar aspect with no drainage.  Psychiatry: Pleasant and cooperative.   Labs on Admission:  Basic Metabolic Panel: No results for input(s): NA, K, CL, CO2, GLUCOSE, BUN, CREATININE, CALCIUM, MG, PHOS in the last 168 hours. Liver Function Tests: No results for input(s): AST, ALT, ALKPHOS, BILITOT, PROT, ALBUMIN in the last 168 hours. No results for input(s): LIPASE, AMYLASE in the last 168 hours. No results for input(s): AMMONIA in the last 168 hours. CBC: No results for input(s): WBC, NEUTROABS, HGB, HCT, MCV, PLT in the last 168 hours. Cardiac Enzymes: No results for input(s): CKTOTAL, CKMB, CKMBINDEX, TROPONINI in the last 168 hours.  BNP (last 3 results) No results for input(s): PROBNP in the last 8760 hours. CBG: Recent Labs  Lab 09/03/19 1608  GLUCAP 117*    Radiological Exams on Admission: No results found.  EKG: No EKG available will order  Assessment/Plan Active Problems:   Respiratory failure, acute (HCC)   HTN (hypertension)   CKD (chronic kidney disease), stage III   Osteomyelitis of right foot (HCC)   Type 2 diabetes mellitus with proliferative retinopathy (HCC)   Acute diastolic CHF (congestive heart failure) (HCC)   Anemia due to chronic kidney disease   Infective endocarditis   Endocarditis   1-Acute hypoxic respiratory failure: Secondary to acute pulmonary edema  secondary to acute diastolic heart failure exacerbation: -Patient currently on 2 L of oxygen, appears stable. -She received 40 mg of IV Lasix twice daily at [facility] -I will continue with 40 mg of IV Lasix daily starting on 09/04/2019. -Monitor renal function and adjust Lasix as needed. - strict I's and O's and daily weight. -Repeat chest x-ray. Addendum;  Cr up to 2.5. hold lasix. Follow chest x ray.  2-Possible aortic valve vegetation, infective endocarditis: I will repeat blood cultures. Blood cultures performed at [facility] has been negative. Patient has been on IV Zosyn, to treat osteomyelitis of the right foot.  She was supposed to complete 4 weeks of IV antibiotics. We will continue with IV Zosyn and IV vancomycin. We will need to consult cardiology in the morning to arrange TEE. ID has been consulted.  3-Chronic kidney disease stage III, prior creatinine range (1.5-1.79 Per care everywhere) Creatinine on presentation and from the foot at 1.6.  Creatinine at [place] on 10/23 was at 1.8. Will repeat B-met  tonight. Monitor on 40 mg of IV Lasix daily. Addendum;  Cr up to 2.5. hold lasix. Check urine sodium, cr, UA.  Will need to discussed with ID other option  For antibiotics other than vancomycin.   4-Diabetes, with complication neuropathy and retinopathy and renal failure: Continue with Lantus 10 units. Sliding scale insulin ordered.  5-Hypertension: Continue with Norvasc and Coreg.  Will hold lisinopril due to AKI.  6-Osteomyelitis of  the right foot and ankle with recent right great toe amputation: Continue with IV antibiotics. As needed oxycodone.  7-Anemia of chronic disease: Hemoglobin on presentation at [facility] was 7.8. Prior hemoglobin range from Care Everywhere on 7--- 7.6. Monitor levels Check anemia panel.Marland Kitchen  8-Constipation Bowel regimen ordered.   DVT Prophylaxis: Heparin Code Status: Full code Family Communication: Care discussed with  patient Disposition Plan: Admit to the hospital for further evaluation of possible endocarditis, continue IV antibiotics, will need TEE.  Time spent: 75 minutes      Elmarie Shiley MD [facility]   09/03/2019, 6:06 PM

## 2019-09-04 ENCOUNTER — Encounter (HOSPITAL_COMMUNITY): Payer: Self-pay

## 2019-09-04 ENCOUNTER — Other Ambulatory Visit: Payer: Self-pay

## 2019-09-04 DIAGNOSIS — L97529 Non-pressure chronic ulcer of other part of left foot with unspecified severity: Secondary | ICD-10-CM

## 2019-09-04 DIAGNOSIS — N183 Chronic kidney disease, stage 3 unspecified: Secondary | ICD-10-CM

## 2019-09-04 DIAGNOSIS — J9601 Acute respiratory failure with hypoxia: Secondary | ICD-10-CM

## 2019-09-04 DIAGNOSIS — I130 Hypertensive heart and chronic kidney disease with heart failure and stage 1 through stage 4 chronic kidney disease, or unspecified chronic kidney disease: Principal | ICD-10-CM

## 2019-09-04 DIAGNOSIS — Z881 Allergy status to other antibiotic agents status: Secondary | ICD-10-CM

## 2019-09-04 DIAGNOSIS — M869 Osteomyelitis, unspecified: Secondary | ICD-10-CM

## 2019-09-04 DIAGNOSIS — Z888 Allergy status to other drugs, medicaments and biological substances status: Secondary | ICD-10-CM

## 2019-09-04 DIAGNOSIS — E1122 Type 2 diabetes mellitus with diabetic chronic kidney disease: Secondary | ICD-10-CM

## 2019-09-04 DIAGNOSIS — E1142 Type 2 diabetes mellitus with diabetic polyneuropathy: Secondary | ICD-10-CM

## 2019-09-04 DIAGNOSIS — Z89412 Acquired absence of left great toe: Secondary | ICD-10-CM

## 2019-09-04 DIAGNOSIS — Z89411 Acquired absence of right great toe: Secondary | ICD-10-CM

## 2019-09-04 DIAGNOSIS — E1169 Type 2 diabetes mellitus with other specified complication: Secondary | ICD-10-CM

## 2019-09-04 DIAGNOSIS — I330 Acute and subacute infective endocarditis: Secondary | ICD-10-CM

## 2019-09-04 DIAGNOSIS — I5030 Unspecified diastolic (congestive) heart failure: Secondary | ICD-10-CM

## 2019-09-04 DIAGNOSIS — E11621 Type 2 diabetes mellitus with foot ulcer: Secondary | ICD-10-CM

## 2019-09-04 LAB — CBC
HCT: 21.1 % — ABNORMAL LOW (ref 36.0–46.0)
Hemoglobin: 6.7 g/dL — CL (ref 12.0–15.0)
MCH: 26.6 pg (ref 26.0–34.0)
MCHC: 31.8 g/dL (ref 30.0–36.0)
MCV: 83.7 fL (ref 80.0–100.0)
Platelets: 232 10*3/uL (ref 150–400)
RBC: 2.52 MIL/uL — ABNORMAL LOW (ref 3.87–5.11)
RDW: 17.8 % — ABNORMAL HIGH (ref 11.5–15.5)
WBC: 12.2 10*3/uL — ABNORMAL HIGH (ref 4.0–10.5)
nRBC: 0 % (ref 0.0–0.2)

## 2019-09-04 LAB — GLUCOSE, CAPILLARY
Glucose-Capillary: 126 mg/dL — ABNORMAL HIGH (ref 70–99)
Glucose-Capillary: 126 mg/dL — ABNORMAL HIGH (ref 70–99)
Glucose-Capillary: 153 mg/dL — ABNORMAL HIGH (ref 70–99)
Glucose-Capillary: 168 mg/dL — ABNORMAL HIGH (ref 70–99)
Glucose-Capillary: 97 mg/dL (ref 70–99)

## 2019-09-04 LAB — URINALYSIS, ROUTINE W REFLEX MICROSCOPIC
Bacteria, UA: NONE SEEN
Bilirubin Urine: NEGATIVE
Glucose, UA: 50 mg/dL — AB
Ketones, ur: NEGATIVE mg/dL
Leukocytes,Ua: NEGATIVE
Nitrite: NEGATIVE
Protein, ur: 100 mg/dL — AB
Specific Gravity, Urine: 1.015 (ref 1.005–1.030)
pH: 6 (ref 5.0–8.0)

## 2019-09-04 LAB — VANCOMYCIN, RANDOM: Vancomycin Rm: 53

## 2019-09-04 LAB — PREPARE RBC (CROSSMATCH)

## 2019-09-04 LAB — BASIC METABOLIC PANEL
Anion gap: 8 (ref 5–15)
BUN: 36 mg/dL — ABNORMAL HIGH (ref 6–20)
CO2: 23 mmol/L (ref 22–32)
Calcium: 7.9 mg/dL — ABNORMAL LOW (ref 8.9–10.3)
Chloride: 103 mmol/L (ref 98–111)
Creatinine, Ser: 2.64 mg/dL — ABNORMAL HIGH (ref 0.44–1.00)
GFR calc Af Amer: 25 mL/min — ABNORMAL LOW (ref >60)
GFR calc non Af Amer: 22 mL/min — ABNORMAL LOW (ref >60)
Glucose, Bld: 133 mg/dL — ABNORMAL HIGH (ref 70–99)
Potassium: 4.3 mmol/L (ref 3.5–5.1)
Sodium: 134 mmol/L — ABNORMAL LOW (ref 135–145)

## 2019-09-04 LAB — ABO/RH: ABO/RH(D): A POS

## 2019-09-04 LAB — RETICULOCYTES
Immature Retic Fract: 20.8 % — ABNORMAL HIGH (ref 2.3–15.9)
RBC.: 2.52 MIL/uL — ABNORMAL LOW (ref 3.87–5.11)
Retic Count, Absolute: 91.5 10*3/uL (ref 19.0–186.0)
Retic Ct Pct: 3.6 % — ABNORMAL HIGH (ref 0.4–3.1)

## 2019-09-04 LAB — FOLATE: Folate: 7.8 ng/mL (ref >5.9)

## 2019-09-04 LAB — SODIUM, URINE, RANDOM: Sodium, Ur: 45 mmol/L

## 2019-09-04 LAB — IRON AND TIBC
Iron: 32 ug/dL (ref 28–170)
Saturation Ratios: 14 % (ref 10.4–31.8)
TIBC: 230 ug/dL — ABNORMAL LOW (ref 250–450)
UIBC: 198 ug/dL

## 2019-09-04 LAB — VITAMIN B12: Vitamin B-12: 1141 pg/mL — ABNORMAL HIGH (ref 180–914)

## 2019-09-04 LAB — FERRITIN: Ferritin: 57 ng/mL (ref 11–307)

## 2019-09-04 MED ORDER — HYDROXYZINE HCL 25 MG PO TABS
25.00 | ORAL_TABLET | ORAL | Status: DC
Start: ? — End: 2019-09-04

## 2019-09-04 MED ORDER — ONDANSETRON HCL 4 MG/2ML IJ SOLN
4.00 | INTRAMUSCULAR | Status: DC
Start: ? — End: 2019-09-04

## 2019-09-04 MED ORDER — SODIUM CHLORIDE 0.9 % IV SOLN
INTRAVENOUS | Status: DC
  Administered 2019-09-04: 18:00:00 via INTRAVENOUS

## 2019-09-04 MED ORDER — CARVEDILOL 12.5 MG PO TABS
25.00 | ORAL_TABLET | ORAL | Status: DC
Start: 2019-09-01 — End: 2019-09-04

## 2019-09-04 MED ORDER — GLUCOSE 40 % PO GEL
15.00 | ORAL | Status: DC
Start: ? — End: 2019-09-04

## 2019-09-04 MED ORDER — BENZONATATE 100 MG PO CAPS
100.00 | ORAL_CAPSULE | ORAL | Status: DC
Start: ? — End: 2019-09-04

## 2019-09-04 MED ORDER — INSULIN LISPRO 100 UNIT/ML ~~LOC~~ SOLN
2.00 | SUBCUTANEOUS | Status: DC
Start: 2019-09-01 — End: 2019-09-04

## 2019-09-04 MED ORDER — SODIUM CHLORIDE 0.9% IV SOLUTION
Freq: Once | INTRAVENOUS | Status: AC
  Administered 2019-09-04: 12:00:00 via INTRAVENOUS

## 2019-09-04 MED ORDER — HYDRALAZINE HCL 10 MG PO TABS
10.00 | ORAL_TABLET | ORAL | Status: DC
Start: ? — End: 2019-09-04

## 2019-09-04 MED ORDER — GENERIC EXTERNAL MEDICATION
2.25 | Status: DC
Start: 2019-09-01 — End: 2019-09-04

## 2019-09-04 MED ORDER — DEXTROSE 10 % IV SOLN
125.00 | INTRAVENOUS | Status: DC
Start: ? — End: 2019-09-04

## 2019-09-04 MED ORDER — FUROSEMIDE 10 MG/ML IJ SOLN
40.0000 mg | Freq: Once | INTRAMUSCULAR | Status: AC
  Administered 2019-09-04: 40 mg via INTRAVENOUS
  Filled 2019-09-04: qty 4

## 2019-09-04 MED ORDER — CEFAZOLIN SODIUM-DEXTROSE 2-4 GM/100ML-% IV SOLN
2.0000 g | Freq: Three times a day (TID) | INTRAVENOUS | Status: DC
  Administered 2019-09-04 – 2019-09-05 (×3): 2 g via INTRAVENOUS
  Filled 2019-09-04 (×4): qty 100

## 2019-09-04 MED ORDER — GLUCAGON HCL RDNA (DIAGNOSTIC) 1 MG IJ SOLR
1.00 | INTRAMUSCULAR | Status: DC
Start: ? — End: 2019-09-04

## 2019-09-04 MED ORDER — GENERIC EXTERNAL MEDICATION
5.00 | Status: DC
Start: 2019-09-01 — End: 2019-09-04

## 2019-09-04 MED ORDER — GENERIC EXTERNAL MEDICATION
324.00 | Status: DC
Start: 2019-09-02 — End: 2019-09-04

## 2019-09-04 MED ORDER — AMLODIPINE BESYLATE 5 MG PO TABS
10.00 | ORAL_TABLET | ORAL | Status: DC
Start: 2019-09-02 — End: 2019-09-04

## 2019-09-04 MED ORDER — GABAPENTIN 400 MG PO CAPS
800.00 | ORAL_CAPSULE | ORAL | Status: DC
Start: 2019-09-01 — End: 2019-09-04

## 2019-09-04 MED ORDER — GENERIC EXTERNAL MEDICATION
5.00 | Status: DC
Start: ? — End: 2019-09-04

## 2019-09-04 MED ORDER — POLYETHYLENE GLYCOL 3350 17 G PO PACK
17.00 | PACK | ORAL | Status: DC
Start: 2019-09-02 — End: 2019-09-04

## 2019-09-04 MED ORDER — SODIUM CHLORIDE 0.9 % IV SOLN
INTRAVENOUS | Status: DC
Start: ? — End: 2019-09-04

## 2019-09-04 MED ORDER — DIPHENHYDRAMINE HCL 25 MG PO CAPS
25.00 | ORAL_CAPSULE | ORAL | Status: DC
Start: ? — End: 2019-09-04

## 2019-09-04 MED ORDER — ZOLPIDEM TARTRATE 5 MG PO TABS
5.00 | ORAL_TABLET | ORAL | Status: DC
Start: ? — End: 2019-09-04

## 2019-09-04 MED ORDER — ACETAMINOPHEN 325 MG PO TABS
650.00 | ORAL_TABLET | ORAL | Status: DC
Start: ? — End: 2019-09-04

## 2019-09-04 MED ORDER — OXYCODONE-ACETAMINOPHEN 10-325 MG PO TABS
1.00 | ORAL_TABLET | ORAL | Status: DC
Start: ? — End: 2019-09-04

## 2019-09-04 MED ORDER — INSULIN GLARGINE 100 UNIT/ML ~~LOC~~ SOLN
10.00 | SUBCUTANEOUS | Status: DC
Start: 2019-09-01 — End: 2019-09-04

## 2019-09-04 MED ORDER — ASPIRIN EC 81 MG PO TBEC
81.00 | DELAYED_RELEASE_TABLET | ORAL | Status: DC
Start: 2019-09-02 — End: 2019-09-04

## 2019-09-04 NOTE — Progress Notes (Signed)
Pharmacy Antibiotic Note  Shawna Martinez is a 43 y.o. female admitted on 09/03/2019 with a wound infection.   Pharmacy had been consulted for vancomycin and zosyn dosing > but pharmacy asked to switch back to cefazolin which she was on PTA  -SCr= 2.64 (range was 1.4-2 in 05/2019), CrCl ~ 30  Plan: -Cefazolin 2g IV q 8 hrs. -Watch renal function carefully, close to needing adjustment to q12 hr dosing.    Height: 5\' 4"  (162.6 cm) Weight: 188 lb 4.8 oz (85.4 kg)(scale a) IBW/kg (Calculated) : 54.7  Temp (24hrs), Avg:98.3 F (36.8 C), Min:98.1 F (36.7 C), Max:98.6 F (37 C)  Recent Labs  Lab 09/03/19 1848 09/04/19 0420  WBC 14.1* 12.2*  CREATININE 2.50* 2.64*  VANCORANDOM  --  53*    Estimated Creatinine Clearance: 29.7 mL/min (A) (by C-G formula based on SCr of 2.64 mg/dL (H)).    Allergies  Allergen Reactions  . Amitriptyline Other (See Comments)    agitation  . Asenapine Anxiety and Other (See Comments)    Seizure-like episodes   . Atorvastatin Other (See Comments)    Inability to walk. Memory impairment.  . Carbamazepine Swelling and Other (See Comments)    Abnormal feelings.     . Nortriptyline Swelling  . Sulfa Antibiotics Nausea And Vomiting  . Sulfamethoxazole-Trimethoprim Nausea And Vomiting  . Sulfasalazine Nausea And Vomiting  . Statins Other (See Comments)    Affect motors skills.     Antimicrobials this admission: 10/24 vanc 10/24 zosyn  Dose adjustments this admission:   Microbiology results: 10/24 blood x2  Thank you for allowing pharmacy to be a part of this patient's care.  Marguerite Olea, [facility] Clinical Pharmacist Phone [phone]  09/04/2019 3:05 PM

## 2019-09-04 NOTE — Evaluation (Signed)
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 PMhx: Dm, neuropathy, HTN, diastolic HF, CKD, Rt great toe amputation 08/27/19 with D/c to [facility] 10/21  Clinical Impression  Pt very pleasant and eager to be able to mobilize. Pt reports progressive toe amputations for 8 years with recent increase in wounds and Darco wear bil since July. Pt with decreased strength, balance and activity tolerance particularly with use of bil Darco shoes. Pt requesting D/C to [facility] due to lack of caregiver support. Pt will benefit from acute therapy to maximize mobility and independence to decrease burden of care.   SpO2 97-100% on Ra throughout gait HR 57-64   Follow Up Recommendations SNF    Equipment Recommendations  None recommended by PT    Recommendations for Other Services       Precautions / Restrictions Precautions Precautions: Fall Required Braces or Orthoses: Other Brace Other Brace: bil Darco shoes      Mobility  Bed Mobility Overal bed mobility: Modified Independent             General bed mobility comments: HOB 30 degrees with pt able to pivot to left without assist  Transfers Overall transfer level: Needs assistance   Transfers: Sit to/from Stand Sit to Stand: Supervision         General transfer comment: cues for hand placement, safety for lines and balance with bil Darco shoes  Ambulation/Gait Ambulation/Gait assistance: Min guard Gait Distance (Feet): 110 Feet Assistive device: Rolling walker (2 wheeled) Gait Pattern/deviations: Step-through pattern;Decreased stride length;Trunk flexed   Gait velocity interpretation: 1.31 - 2.62 ft/sec, indicative of limited community ambulator General Gait Details: cues for posture, position in RW and maintaining weight on bil  heels  Stairs            Wheelchair Mobility    Modified Rankin (Stroke Patients Only)       Balance Overall balance assessment: Needs assistance   Sitting balance-Leahy Scale: Good     Standing balance support: Bilateral upper extremity supported Standing balance-Leahy Scale: Poor Standing balance comment: bil UE support for gait                             Pertinent Vitals/Pain Pain Assessment: 0-10 Pain Score: 3  Pain Location: bil LE with ambulation Pain Descriptors / Indicators: Aching;Sore Pain Intervention(s): Limited activity within patient's tolerance;Monitored during session;Repositioned    Home Living Family/patient expects to be discharged to:: Private residence Living Arrangements: Children Available Help at Discharge: Family Type of Home: Apartment Home Access: Stairs to enter   Technical brewer of Steps: 3 Home Layout: Two level Home Equipment: Environmental consultant - 2 wheels;Bedside commode Additional Comments: daughter currently unemployed    Prior Function Level of Independence: Independent with assistive device(s)         Comments: uses walker off and on since July     Hand Dominance        Extremity/Trunk Assessment   Upper Extremity Assessment Upper Extremity Assessment: Generalized weakness    Lower Extremity Assessment Lower Extremity Assessment: Generalized weakness    Cervical / Trunk Assessment Cervical / Trunk Assessment: Normal  Communication   Communication: No difficulties  Cognition Arousal/Alertness: Awake/alert Behavior During Therapy: WFL for tasks assessed/performed Overall Cognitive Status: Within Functional Limits for tasks assessed  General Comments      Exercises     Assessment/Plan    PT Assessment Patient needs continued PT services  PT Problem List Decreased mobility;Decreased activity tolerance;Decreased balance;Decreased  knowledge of use of DME       PT Treatment Interventions Gait training;Functional mobility training;Therapeutic activities;Patient/family education;DME instruction;Therapeutic exercise;Balance training    PT Goals (Current goals can be found in the Care Plan section)  Acute Rehab PT Goals Patient Stated Goal: be able to walk and return home after rehab PT Goal Formulation: With patient Time For Goal Achievement: 09/18/19 Potential to Achieve Goals: Fair    Frequency Min 2X/week   Barriers to discharge Decreased caregiver support pt with 18yo daughter who isn't working but reports she cannot provide assist at D/c    Co-evaluation               AM-PAC PT "6 Clicks" Mobility  Outcome Measure Help needed turning from your back to your side while in a flat bed without using bedrails?: A Little Help needed moving from lying on your back to sitting on the side of a flat bed without using bedrails?: A Little Help needed moving to and from a bed to a chair (including a wheelchair)?: A Little Help needed standing up from a chair using your arms (e.g., wheelchair or bedside chair)?: A Little Help needed to walk in hospital room?: A Little Help needed climbing 3-5 steps with a railing? : A Little 6 Click Score: 18    End of Session Equipment Utilized During Treatment: Other (comment)(bil Darco shoes) Activity Tolerance: Patient tolerated treatment well Patient left: in chair;with call bell/phone within reach;with nursing/sitter in room Nurse Communication: Mobility status;Precautions PT Visit Diagnosis: Other abnormalities of gait and mobility (R26.89)    Time: 5009-3818 PT Time Calculation (min) (ACUTE ONLY): 23 min   Charges:   PT Evaluation $PT Eval Moderate Complexity: 1 Mod          Aron Inge Abner Greenspan, PT Acute Rehabilitation Services Pager: [phone] Office: [phone]   Lanijah Warzecha B Hayli Milligan 09/04/2019, 2:58 PM

## 2019-09-04 NOTE — Progress Notes (Signed)
PROGRESS NOTE    Shawna Chaletara Niswander  BMW:[id number] DOB: 05-22-1976 DOA: 09/03/2019 PCP: Galvin ProfferHague, Imran P, MD     Brief Narrative:  Shawna Martinez is a 43 y.o. female with past medical history significant for diabetes type 2 insulin-dependent, diabetic neuropathy, hypertension, diastolic heart failure, stage III chronic kidney disease prior creatinine levels 1.5-1.7 (05-2019), prior history of osteomyelitis of the right ankle and foot and recently underwent right great toe amputation (on October 17). Patient was recently hospitalized at [facility] for surgery and discharged to skilled nursing facility on 10/21.  She was discharged on IV Zosyn to treat underlying osteomyelitis with recommendation to complete 4 weeks of IV antibiotics.  Patient's home dose lasix was held at discharge due to AKI on chronic kidney disease. Patient [facility] to [facility] emergency department on 09/02/2019 with worsening shortness of breath and acute hypoxic respiratory failure requiring 4 L of oxygen.  Chest x-ray show pulmonary edema or multifocal pneumonia with possible small left pleural effusion.  CTA was negative for PE finding concerning with heart failure versus pneumonia.  During evaluation for heart failure exacerbation patient had 2D echo performed that showed preserved ejection fraction of 70%, possible small vegetation on the cusp of the aortic valve suggestive of endocarditis. Case was discussed with cardiology who recommended patient to be transferred to [facility] for definitive study, TEE. Patient was treated with IV Lasix 40 mg IV twice daily, with improvement of symptoms. IV Zosyn and IV vancomycin continued for presumed endocarditis.  New events last 24 hours / Subjective: This morning, patient states that her breathing is back to her baseline.  No new complaints on examination.  Assessment & Plan:   Active Problems:   Respiratory failure, acute (HCC)   HTN (hypertension)   CKD (chronic kidney  disease), stage III   Osteomyelitis of right foot (HCC)   Type 2 diabetes mellitus with proliferative retinopathy (HCC)   Acute diastolic CHF (congestive heart failure) (HCC)   Anemia due to chronic kidney disease   Infective endocarditis   Endocarditis   Acute hypoxemic respiratory failure secondary to acute on chronic diastolic heart failure -Patient received IV Lasix at [facility] prior to transfer -IV Lasix further held due to worsening kidney function -Chest x-ray revealed mild cardiomegaly without edema or consolidation -Continue to wean nasal cannula O2 to room air  Possible aortic valve vegetation, infective endocarditis -Echocardiogram completed at [facility] revealed possible small vegetation on the cusp of the aortic valve suggestive of endocarditis -Blood cultures pending -Messaged cardiology to schedule for TEE this week -ID consulted   AKI on CKD stage III -Baseline creatinine 1.5-1.79 -Creatinine at [facility] on 10/23 was 1.8 -Creatinine trending upward due to IV Lasix, vancomycin -Avoid nephrotoxin -Continue to trend BMP  Type 2 diabetes, insulin-dependent, with neuropathy -Hemoglobin A1c 7.3 -Continue Lantus, sliding scale insulin -Continue gabapentin  Hypertension -Continue Norvasc, Coreg.  Lisinopril on hold due to AKI  Osteomyelitis of right foot, ankle with recent right great toe amputation -Patient had been scheduled for IV Zosyn for 4 weeks  Anemia of chronic kidney disease -Baseline hemoglobin around 7-7.6 -No signs of acute blood loss -Continue ferrous sulfate -Hemoglobin this morning 6.7, transfuse packed red blood cells today and trend CBC   DVT prophylaxis: Subcutaneous heparin Code Status: Full code Family Communication: None at bedside Disposition Plan: Pending further evaluation of TEE, improvement in creatinine   Consultants:   Infectious disease called at the time of admission  Cardiology for TEE scheduling  Procedures:   None  Antimicrobials:  Anti-infectives (From admission, onward)   Start     Dose/Rate Route Frequency Ordered Stop   09/04/19 0100  piperacillin-tazobactam (ZOSYN) IVPB 3.375 g     3.375 g 12.5 mL/hr over 240 Minutes Intravenous Every 8 hours 09/03/19 1746     09/03/19 2014  vancomycin variable dose per unstable renal function (pharmacist dosing)      Does not apply See admin instructions 09/03/19 2014     09/03/19 1830  vancomycin (VANCOCIN) 1,750 mg in sodium chloride 0.9 % 500 mL IVPB  Status:  Discontinued     1,750 mg 250 mL/hr over 120 Minutes Intravenous  Once 09/03/19 1746 09/03/19 2236   09/03/19 1800  piperacillin-tazobactam (ZOSYN) IVPB 3.375 g     3.375 g 100 mL/hr over 30 Minutes Intravenous  Once 09/03/19 1746 09/03/19 2335        Objective: Vitals:   09/04/19 0826 09/04/19 1055 09/04/19 1200 09/04/19 1231  BP: (!) 145/58 (!) 145/62 (!) 159/67 (!) 161/67  Pulse: (!) 59  (!) 59 62  Resp: 18  17 18   Temp: 98.4 F (36.9 C)  98.5 F (36.9 C) 98.1 F (36.7 C)  TempSrc: Oral  Oral Oral  SpO2: 100%  99% 100%  Weight:      Height:        Intake/Output Summary (Last 24 hours) at 09/04/2019 1236 Last data filed at 09/04/2019 1015 Gross per 24 hour  Intake 960 ml  Output 1400 ml  Net -440 ml   Filed Weights   09/03/19 1500 09/04/19 0612  Weight: 85.5 kg 85.4 kg    Examination:  General exam: Appears calm and comfortable  Respiratory system: Clear to auscultation. Respiratory effort normal. No respiratory distress. No conversational dyspnea.  Cardiovascular system: S1 & S2 heard, RRR. No murmurs. No pedal edema. Gastrointestinal system: Abdomen is nondistended, soft and nontender. Normal bowel sounds heard. Central nervous system: Alert and oriented. No focal neurological deficits. Speech clear.  Extremities: Bilateral feet with toe amputations, dressing clean and dry  Psychiatry: Judgement and insight appear normal. Mood & affect  appropriate.   Data Reviewed: I have personally reviewed following labs and imaging studies  CBC: Recent Labs  Lab 09/03/19 1848 09/04/19 0420  WBC 14.1* 12.2*  HGB 7.8* 6.7*  HCT 23.9* 21.1*  MCV 81.8 83.7  PLT 282 382   Basic Metabolic Panel: Recent Labs  Lab 09/03/19 1848 09/04/19 0420  NA 133* 134*  K 4.5 4.3  CL 102 103  CO2 19* 23  GLUCOSE 121* 133*  BUN 38* 36*  CREATININE 2.50* 2.64*  CALCIUM 8.5* 7.9*   GFR: Estimated Creatinine Clearance: 29.7 mL/min (A) (by C-G formula based on SCr of 2.64 mg/dL (H)). Liver Function Tests: Recent Labs  Lab 09/03/19 1848  AST 16  ALT 13  ALKPHOS 84  BILITOT 0.4  PROT 7.1  ALBUMIN 2.7*   No results for input(s): LIPASE, AMYLASE in the last 168 hours. No results for input(s): AMMONIA in the last 168 hours. Coagulation Profile: No results for input(s): INR, PROTIME in the last 168 hours. Cardiac Enzymes: No results for input(s): CKTOTAL, CKMB, CKMBINDEX, TROPONINI in the last 168 hours. BNP (last 3 results) No results for input(s): PROBNP in the last 8760 hours. HbA1C: Recent Labs    09/03/19 1848  HGBA1C 7.3*   CBG: Recent Labs  Lab 09/03/19 1608 09/03/19 2137 09/04/19 0025 09/04/19 0618  GLUCAP 117* 177* 168* 126*   Lipid Profile:  No results for input(s): CHOL, HDL, LDLCALC, TRIG, CHOLHDL, LDLDIRECT in the last 72 hours. Thyroid Function Tests: No results for input(s): TSH, T4TOTAL, FREET4, T3FREE, THYROIDAB in the last 72 hours. Anemia Panel: Recent Labs    09/04/19 0420  VITAMINB12 1,141*  FOLATE 7.8  FERRITIN 57  TIBC 230*  IRON 32  RETICCTPCT 3.6*   Sepsis Labs: No results for input(s): PROCALCITON, LATICACIDVEN in the last 168 hours.  No results found for this or any previous visit (from the past 240 hour(s)).    Radiology Studies: Dg Chest 2 View  Result Date: 09/04/2019 CLINICAL DATA:  Wound infection EXAM: CHEST - 2 VIEW COMPARISON:  September 02, 2019 FINDINGS: There is slight  atelectasis in the right mid lung. The lungs elsewhere are clear. Heart is mildly enlarged with pulmonary vascularity. No adenopathy. Central catheter tip is at the cavoatrial junction. No pneumothorax. No bone lesions. IMPRESSION: Mild cardiomegaly. Pulmonary vascularity normal. No adenopathy. Mild atelectasis right mid lung. No edema or consolidation. Electronically Signed   By: Bretta Bang III M.D.   On: 09/04/2019 08:57   Dg Abd 1 View  Result Date: 09/04/2019 CLINICAL DATA:  Wound infection and constipation EXAM: ABDOMEN - 1 VIEW COMPARISON:  None. FINDINGS: There is diffuse stool throughout colon. There is no bowel dilatation or air-fluid level to suggest bowel obstruction. No free air. There is a 2 mm calcification either in or overlying the medial upper left kidney. There are foci of iliac artery atherosclerosis bilaterally. IMPRESSION: Diffuse stool throughout colon, a finding indicative of constipation. No bowel obstruction or free air. 2 mm calcification either in or overlying the medial upper pole left kidney. Electronically Signed   By: Bretta Bang III M.D.   On: 09/04/2019 08:58      Scheduled Meds: . amLODipine  10 mg Oral Daily  . aspirin EC  81 mg Oral Daily  . carvedilol  12.5 mg Oral BID WC  . Chlorhexidine Gluconate Cloth  6 each Topical Daily  . docusate sodium  100 mg Oral BID  . ferrous sulfate  325 mg Oral BID WC  . furosemide  40 mg Intravenous Once  . gabapentin  800 mg Oral TID  . heparin  5,000 Units Subcutaneous Q8H  . insulin aspart  0-9 Units Subcutaneous TID WC  . insulin glargine  15 Units Subcutaneous QHS  . pantoprazole  40 mg Oral BID  . senna  1 tablet Oral BID  . sodium chloride flush  10-40 mL Intracatheter Q12H  . sodium chloride flush  3 mL Intravenous Q12H  . vancomycin variable dose per unstable renal function (pharmacist dosing)   Does not apply See admin instructions   Continuous Infusions: . sodium chloride    .  piperacillin-tazobactam (ZOSYN)  IV 3.375 g (09/04/19 0855)     LOS: 1 day      Time spent: 40 minutes   Noralee Stain, DO [facility] 09/04/2019, 12:36 PM   Available via Epic secure chat 7am-7pm After these hours, please refer to coverage provider listed on [email or web address]

## 2019-09-04 NOTE — Consult Note (Addendum)
[facility]  Total days of antibiotics 2/vancomycin and piptazo         Reason for Consult: osteomyelitis c/b endocarditis   Referring Physician: regalado  Active Problems:   Respiratory failure, acute (HCC)   HTN (hypertension)   CKD (chronic kidney disease), stage III   Osteomyelitis of right foot (HCC)   Type 2 diabetes mellitus with proliferative retinopathy (HCC)   Acute diastolic CHF (congestive heart failure) (HCC)   Anemia due to chronic kidney disease   Infective endocarditis   Endocarditis    HPI: Shawna Martinez is a 43 y.o. female  With hx of poorly controlled IDDM complicated by peripheral neuropathy, CKD3,  Hx of left great toe nad metatarsal amputation. 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 She was last seen by by [facility] ID group on 9/29 who recommended to continue the last week of abtx then switch to keflex 1gm TID however she was readmitted with pain and drainage to right foot. She underwent amputation of right great toe and debridement of left plantar ulcer on 10/17 - with recommendation to do 4 wk of piptazo. During hospitalization she had her lasix held due to elevated creatinine. She was discharged to snf for wound care management and iv abtx. She was then brought initially to [facility] for worsening shortness of breath and work up for CHF found that she had small vegetation to AV. She was transferred to [facility] for further management.   OR cx from 10/17:showed enterobacter cloacae but wound cx on 10/13 had mssa, e.faecalis, and enterobacter sp.  PMHX: IDDM c/b DFU osteo CHF with perserved function HTN HLD Hx of tobacco use Cataracts Hx of left foot surgery Hx of  right foot surgery Hx of c section Hx of kidney stone surgery  Allergies:  Allergies  Allergen Reactions  . Amitriptyline Other (See Comments)    agitation  . Asenapine Anxiety and Other (See Comments)    Seizure-like episodes   . Atorvastatin Other (See Comments)    Inability to walk. Memory impairment.  . Carbamazepine Swelling and Other (See Comments)    Abnormal feelings.     . Nortriptyline Swelling  . Sulfa Antibiotics Nausea And Vomiting  . Sulfamethoxazole-Trimethoprim Nausea And Vomiting  . Sulfasalazine Nausea And Vomiting  . Statins Other (See Comments)    Affect motors skills.     MEDICATIONS: . amLODipine  10 mg Oral Daily  . aspirin EC  81 mg Oral Daily  . carvedilol  12.5 mg Oral BID WC  . Chlorhexidine Gluconate Cloth  6 each Topical Daily  . docusate sodium  100 mg Oral BID  . ferrous sulfate  325 mg Oral BID WC  . gabapentin  800 mg Oral TID  . heparin  5,000 Units Subcutaneous Q8H  . insulin aspart  0-9 Units Subcutaneous TID WC  . insulin glargine  15 Units Subcutaneous QHS  . pantoprazole  40 mg Oral BID  . senna  1 tablet Oral BID  . sodium chloride flush  10-40 mL Intracatheter Q12H  . sodium chloride flush  3 mL Intravenous Q12H    Social History   Tobacco Use  . Smoking status: Not on file  Substance  Use Topics  . Alcohol use: Not on file  . Drug use: Not on file   Family hx: kidney disease and htn -paternal side of family. Cataracts -MGM side  Review of Systems  Constitutional: Negative for fever, chills, diaphoresis, activity change, appetite change, fatigue and unexpected weight change.  HENT: Negative for congestion, sore throat, rhinorrhea, sneezing, trouble swallowing and sinus pressure.  Eyes: Negative for photophobia and visual disturbance.  Respiratory: Negative for cough, chest tightness, positive shortness of breath, wheezing and stridor.  Cardiovascular: Negative for chest pain, palpitations and leg swelling.   Gastrointestinal: Negative for nausea, vomiting, abdominal pain, diarrhea, constipation, blood in stool, abdominal distention and anal bleeding.  Genitourinary: Negative for dysuria, hematuria, flank pain and difficulty urinating.  Musculoskeletal: Negative for myalgias, back pain, joint swelling, arthralgias and gait problem.  Skin: positive wounds.  Neurological: Negative for dizziness, tremors, weakness and light-headedness.  Hematological: Negative for adenopathy. Does not bruise/bleed easily.  Psychiatric/Behavioral: Negative for behavioral problems, confusion, sleep disturbance, dysphoric mood, decreased concentration and agitation.     OBJECTIVE: Temp:  [98.1 F (36.7 C)-98.6 F (37 C)] 98.1 F (36.7 C) (10/25 1231) Pulse Rate:  [56-62] 62 (10/25 1231) Resp:  [14-18] 18 (10/25 1231) BP: (143-161)/(58-79) 161/67 (10/25 1231) SpO2:  [92 %-100 %] 99 % (10/25 1253) Weight:  [85.4 kg-85.5 kg] 85.4 kg (10/25 0612) Physical Exam  Constitutional:  oriented to person, place, and time. appears well-developed and well-nourished. No distress.  HENT: /AT, PERRLA, no scleral icterus Mouth/Throat: Oropharynx is clear and moist. No oropharyngeal exudate.  Cardiovascular: Normal rate, regular rhythm and normal heart sounds. Exam reveals no gallop and no friction rub.  No murmur heard.  Pulmonary/Chest: Effort normal and breath sounds normal. No respiratory distress.  has no wheezes.  Neck = supple, no nuchal rigidity Abdominal: Soft. Bowel sounds are normal.  exhibits no distension. There is no tenderness.  Lymphadenopathy: no cervical adenopathy. No axillary adenopathy Neurological: alert and oriented to person, place, and time.  Skin: well approximated edges  From incision to right foot. No oozing, no erythema. Right plantar lesion no drainage OHY:[id number] foot wrapped from recent surgery Psychiatric: a normal mood and affect.  behavior is normal.    LABS: Results for orders placed  or performed during the hospital encounter of 09/03/19 (from the past 48 hour(s))  Glucose, capillary     Status: Abnormal   Collection Time: 09/03/19  4:08 PM  Result Value Ref Range   Glucose-Capillary 117 (H) 70 - 99 mg/dL  HIV Antibody (routine testing w rflx)     Status: None   Collection Time: 09/03/19  6:48 PM  Result Value Ref Range   HIV Screen 4th Generation wRfx NON REACTIVE NON REACTIVE    Comment: Performed at [facility], [address] [place], [place] [address]  Hemoglobin A1c     Status: Abnormal   Collection Time: 09/03/19  6:48 PM  Result Value Ref Range   Hgb A1c MFr Bld 7.3 (H) 4.8 - 5.6 %    Comment: (NOTE) Pre diabetes:          5.7%-6.4% Diabetes:              >6.4% Glycemic control for   <7.0% adults with diabetes    Mean Plasma Glucose 162.81 mg/dL    Comment: Performed at [facility], [address] [place], [place] [address]  Comprehensive metabolic panel     Status: Abnormal   Collection Time: 09/03/19  6:48 PM  Result Value Ref Range   Sodium 133 (L) 135 - 145 mmol/L   Potassium 4.5 3.5 - 5.1 mmol/L   Chloride 102 98 - 111 mmol/L   CO2 19 (L) 22 - 32 mmol/L   Glucose, Bld 121 (H) 70 - 99 mg/dL   BUN 38 (H) 6 - 20 mg/dL   Creatinine, Ser 1.61 (H) 0.44 - 1.00 mg/dL   Calcium 8.5 (L) 8.9 - 10.3 mg/dL   Total Protein 7.1 6.5 - 8.1 g/dL   Albumin 2.7 (L) 3.5 - 5.0 g/dL   AST 16 15 - 41 U/L   ALT 13 0 - 44 U/L   Alkaline Phosphatase 84 38 - 126 U/L   Total Bilirubin 0.4 0.3 - 1.2 mg/dL   GFR calc non Af Amer 23 (L) >60 mL/min   GFR calc Af Amer 27 (L) >60 mL/min   Anion gap 12 5 - 15    Comment: Performed at [facility], [address] [place], [place] [address]  CBC     Status: Abnormal   Collection Time: 09/03/19  6:48 PM  Result Value Ref Range   WBC 14.1 (H) 4.0 - 10.5 K/uL   RBC 2.92 (L) 3.87 - 5.11 MIL/uL   Hemoglobin 7.8 (L) 12.0 - 15.0 g/dL   HCT 54.0 (L) 98.1 - 19.1 %   MCV 81.8 80.0 - 100.0 fL   MCH 26.7 26.0  - 34.0 pg   MCHC 32.6 30.0 - 36.0 g/dL   RDW 47.8 (H) 29.5 - 62.1 %   Platelets 282 150 - 400 K/uL   nRBC 0.0 0.0 - 0.2 %    Comment: Performed at [facility], [address], [place], [place] [address]  Glucose, capillary     Status: Abnormal   Collection Time: 09/03/19  9:37 PM  Result Value Ref Range   Glucose-Capillary 177 (H) 70 - 99 mg/dL   Comment 1 Notify RN    Comment 2 Document in Chart   Glucose, capillary     Status: Abnormal   Collection Time: 09/04/19 12:25 AM  Result Value Ref Range   Glucose-Capillary 168 (H) 70 - 99 mg/dL   Comment 1 Notify RN    Comment 2 Document in Chart   Sodium, urine, random     Status: None   Collection Time: 09/04/19 12:52 AM  Result Value Ref Range   Sodium, Ur 45 mmol/L    Comment: Performed at [facility] Lab, [address] [place], [place] [address]  Urinalysis, Routine w reflex microscopic     Status: Abnormal   Collection Time: 09/04/19  1:12 AM  Result Value Ref Range   Color, Urine YELLOW YELLOW   APPearance CLEAR CLEAR   Specific Gravity, Urine 1.015 1.005 - 1.030   pH 6.0 5.0 - 8.0   Glucose, UA 50 (A) NEGATIVE mg/dL   Hgb urine dipstick SMALL (A) NEGATIVE   Bilirubin Urine NEGATIVE NEGATIVE   Ketones, ur NEGATIVE NEGATIVE mg/dL   Protein, ur 629 (A) NEGATIVE mg/dL   Nitrite NEGATIVE NEGATIVE   Leukocytes,Ua NEGATIVE NEGATIVE   RBC / HPF 0-5 0 - 5 RBC/hpf   WBC, UA 0-5 0 - 5 WBC/hpf   Bacteria, UA NONE SEEN NONE SEEN   Squamous Epithelial / LPF 0-5 0 - 5   Hyaline Casts, UA PRESENT     Comment: Performed at [facility], [address] [place], [place] [address]  Basic metabolic  panel     Status: Abnormal   Collection Time: 09/04/19  4:20 AM  Result Value Ref Range   Sodium 134 (L) 135 - 145 mmol/L   Potassium 4.3 3.5 - 5.1 mmol/L   Chloride 103 98 - 111 mmol/L   CO2 23 22 - 32 mmol/L   Glucose, Bld 133 (H) 70 - 99 mg/dL   BUN 36 (H) 6 - 20 mg/dL   Creatinine, Ser 2.64 (H) 0.44 - 1.00 mg/dL    Calcium 7.9 (L) 8.9 - 10.3 mg/dL   GFR calc non Af Amer 22 (L) >60 mL/min   GFR calc Af Amer 25 (L) >60 mL/min   Anion gap 8 5 - 15    Comment: Performed at [facility] [address] [place] [address]  CBC     Status: Abnormal   Collection Time: 09/04/19  4:20 AM  Result Value Ref Range   WBC 12.2 (H) 4.0 - 10.5 K/uL   RBC 2.52 (L) 3.87 - 5.11 MIL/uL   Hemoglobin 6.7 (LL) 12.0 - 15.0 g/dL    Comment: REPEATED TO VERIFY THIS CRITICAL RESULT HAS VERIFIED AND BEEN CALLED TO F.JONES,RN BY MELISSA BROGDON ON 10 25 2020 AT 0513, AND HAS BEEN READ BACK.     HCT 21.1 (L) 36.0 - 46.0 %   MCV 83.7 80.0 - 100.0 fL   MCH 26.6 26.0 - 34.0 pg   MCHC 31.8 30.0 - 36.0 g/dL   RDW 17.8 (H) 11.5 - 15.5 %   Platelets 232 150 - 400 K/uL   nRBC 0.0 0.0 - 0.2 %    Comment: Performed at [facility] [address], [place], [place] [address]  Vitamin B12     Status: Abnormal   Collection Time: 09/04/19  4:20 AM  Result Value Ref Range   Vitamin B-12 1,141 (H) 180 - 914 pg/mL    Comment: (NOTE) This assay is not validated for testing neonatal or myeloproliferative syndrome specimens for Vitamin B12 levels. Performed at [facility], [place] [address] [place], [place] [address]   Iron and TIBC     Status: Abnormal   Collection Time: 09/04/19  4:20 AM  Result Value Ref Range   Iron 32 28 - 170 ug/dL   TIBC 230 (L) 250 - 450 ug/dL   Saturation Ratios 14 10.4 - 31.8 %   UIBC 198 ug/dL    Comment: Performed at [facility], [place] [address] [place], [place] [address]  Ferritin     Status: None   Collection Time: 09/04/19  4:20 AM  Result Value Ref Range   Ferritin 57 11 - 307 ng/mL    Comment: Performed at [facility], [place] [address], [place], [place] [address]  Reticulocytes     Status: Abnormal   Collection Time: 09/04/19  4:20 AM  Result Value Ref Range   Retic Ct Pct 3.6 (H) 0.4 - 3.1 %   RBC. 2.52 (L) 3.87 - 5.11 MIL/uL   Retic Count, Absolute 91.5  19.0 - 186.0 K/uL   Immature Retic Fract 20.8 (H) 2.3 - 15.9 %    Comment: Performed at [facility] [address], [place], [place] [address]  Vancomycin, random     Status: Abnormal   Collection Time: 09/04/19  4:20 AM  Result Value Ref Range   Vancomycin Rm 53 (HH)     Comment:        Random Vancomycin  therapeutic range is dependent on dosage and time of specimen collection. A peak range is 20.0-40.0 ug/mL A trough range is 5.0-15.0 ug/mL        CRITICAL RESULT CALLED TO, READ BACK BY AND VERIFIED WITH: RBV Yetta Barre [facility] 09/04/19 0516 WAYK Performed at [facility], [address] [place], [place] [address]   Folate     Status: None   Collection Time: 09/04/19  4:20 AM  Result Value Ref Range   Folate 7.8 >5.9 ng/mL    Comment: Performed at [facility], [address], [place], [place] [address]  Glucose, capillary     Status: Abnormal   Collection Time: 09/04/19  6:18 AM  Result Value Ref Range   Glucose-Capillary 126 (H) 70 - 99 mg/dL   Comment 1 Notify RN    Comment 2 Document in Chart   Prepare RBC     Status: None   Collection Time: 09/04/19  7:44 AM  Result Value Ref Range   Order Confirmation      ORDER PROCESSED BY BLOOD BANK Performed at [facility], [address], [place], [place] [address]   Type and screen [facility]     Status: None (Preliminary result)   Collection Time: 09/04/19  8:00 AM  Result Value Ref Range   ABO/RH(D) A POS    Antibody Screen NEG    Sample Expiration 09/07/2019,2359    Unit Number [id number]    Blood Component Type RED CELLS,LR    Unit division 00    Status of Unit ISSUED    Transfusion Status OK TO TRANSFUSE    Crossmatch Result      Compatible Performed at [facility], [address], [place], [place] [address]    Unit Number [id number]    Blood Component Type RED CELLS,LR    Unit division 00    Status of Unit ALLOCATED    Transfusion Status OK TO TRANSFUSE     Crossmatch Result Compatible   ABO/Rh     Status: None (Preliminary result)   Collection Time: 09/04/19  8:00 AM  Result Value Ref Range   ABO/RH(D)      A POS Performed at [facility], [address] [place], [place] [address]   Glucose, capillary     Status: None   Collection Time: 09/04/19 11:57 AM  Result Value Ref Range   Glucose-Capillary 97 70 - 99 mg/dL    MICRO: 27/25 blood cx pending IMAGING: Dg Chest 2 View  Result Date: 09/04/2019 CLINICAL DATA:  Wound infection EXAM: CHEST - 2 VIEW COMPARISON:  September 02, 2019 FINDINGS: There is slight atelectasis in the right mid lung. The lungs elsewhere are clear. Heart is mildly enlarged with pulmonary vascularity. No adenopathy. Central catheter tip is at the cavoatrial junction. No pneumothorax. No bone lesions. IMPRESSION: Mild cardiomegaly. Pulmonary vascularity normal. No adenopathy. Mild atelectasis right mid lung. No edema or consolidation. Electronically Signed   By: Bretta Bang III M.D.   On: 09/04/2019 08:57   Dg Abd 1 View  Result Date: 09/04/2019 CLINICAL DATA:  Wound infection and constipation EXAM: ABDOMEN - 1 VIEW COMPARISON:  None. FINDINGS: There is diffuse stool throughout colon. There is no bowel dilatation or air-fluid level to suggest bowel obstruction. No free air. There is a 2 mm calcification either in or overlying the medial upper left kidney. There are foci of iliac artery atherosclerosis bilaterally. IMPRESSION: Diffuse stool throughout colon,  a finding indicative of constipation. No bowel obstruction or free air. 2 mm calcification either in or overlying the medial upper pole left kidney. Electronically Signed   By: Bretta BangWilliam  Woodruff III M.D.   On: 09/04/2019 08:58   Assessment/Plan:  43yo F with history of DFU osteomyelitis admitted for shortness of breath which could be related for heart failure management incidentally found to have AV vegetation, while being treated for DFU osteo  -recommend  to get TEE to see if vegetation is seen to AV cusp.  - given recent hx of being treated for MSSA, suspect that vegetation would be related to mssa infection - recommend treatment with cefazolin 2gm IV Q8hr -  Recent hx of diabetic foot osteomyelitis s/p amputation   Addendum - given review of micro data from HPR - recommend to change to piptazo  DM with diabetic neuropathy = continue with tight control of BS  dfu = defer to wound care recs  chf = primary team restarted diuretics

## 2019-09-04 NOTE — Progress Notes (Addendum)
    [facility] has been requested to perform a transesophageal echocardiogram tentatively planned for 10/26/202 for possible aortic valve endocarditis.  After careful review of history and examination, the risks and benefits of transesophageal echocardiogram have been explained including risks of esophageal damage, perforation (1:10,000 risk), bleeding, pharyngeal hematoma as well as other potential complications associated with conscious sedation including aspiration, arrhythmia, respiratory failure and death. Alternatives to treatment were discussed, questions were answered. Patient is willing to proceed.   TEE tentatively planned for 09/05/2019 with Dr. Acie Fredrickson. Timing TBD   Roby Lofts, PA-C 09/04/2019 2:11 PM

## 2019-09-05 ENCOUNTER — Inpatient Hospital Stay (HOSPITAL_COMMUNITY): Payer: Medicaid Other | Admitting: Certified Registered Nurse Anesthetist

## 2019-09-05 ENCOUNTER — Encounter (HOSPITAL_COMMUNITY): Payer: Self-pay | Admitting: General Practice

## 2019-09-05 ENCOUNTER — Encounter (HOSPITAL_COMMUNITY)
Admission: AD | Disposition: A | Discharge status: Skilled Nursing Facility | Payer: Self-pay | Source: Other Acute Inpatient Hospital | Attending: Internal Medicine

## 2019-09-05 ENCOUNTER — Inpatient Hospital Stay (HOSPITAL_COMMUNITY): Payer: Medicaid Other

## 2019-09-05 DIAGNOSIS — R7881 Bacteremia: Secondary | ICD-10-CM

## 2019-09-05 HISTORY — PX: TEE WITHOUT CARDIOVERSION: SHX5443

## 2019-09-05 LAB — TYPE AND SCREEN
ABO/RH(D): A POS
Antibody Screen: NEGATIVE
Unit division: 0
Unit division: 0

## 2019-09-05 LAB — BASIC METABOLIC PANEL
Anion gap: 8 (ref 5–15)
BUN: 36 mg/dL — ABNORMAL HIGH (ref 6–20)
CO2: 23 mmol/L (ref 22–32)
Calcium: 8 mg/dL — ABNORMAL LOW (ref 8.9–10.3)
Chloride: 107 mmol/L (ref 98–111)
Creatinine, Ser: 2.79 mg/dL — ABNORMAL HIGH (ref 0.44–1.00)
GFR calc Af Amer: 23 mL/min — ABNORMAL LOW (ref >60)
GFR calc non Af Amer: 20 mL/min — ABNORMAL LOW (ref >60)
Glucose, Bld: 101 mg/dL — ABNORMAL HIGH (ref 70–99)
Potassium: 4 mmol/L (ref 3.5–5.1)
Sodium: 138 mmol/L (ref 135–145)

## 2019-09-05 LAB — CBC
HCT: 27.2 % — ABNORMAL LOW (ref 36.0–46.0)
Hemoglobin: 9.1 g/dL — ABNORMAL LOW (ref 12.0–15.0)
MCH: 28.2 pg (ref 26.0–34.0)
MCHC: 33.5 g/dL (ref 30.0–36.0)
MCV: 84.2 fL (ref 80.0–100.0)
Platelets: 245 10*3/uL (ref 150–400)
RBC: 3.23 MIL/uL — ABNORMAL LOW (ref 3.87–5.11)
RDW: 16.6 % — ABNORMAL HIGH (ref 11.5–15.5)
WBC: 10.7 10*3/uL — ABNORMAL HIGH (ref 4.0–10.5)
nRBC: 0 % (ref 0.0–0.2)

## 2019-09-05 LAB — GLUCOSE, CAPILLARY
Glucose-Capillary: 90 mg/dL (ref 70–99)
Glucose-Capillary: 88 mg/dL (ref 70–99)
Glucose-Capillary: 75 mg/dL (ref 70–99)
Glucose-Capillary: 158 mg/dL — ABNORMAL HIGH (ref 70–99)
Glucose-Capillary: 151 mg/dL — ABNORMAL HIGH (ref 70–99)

## 2019-09-05 LAB — CREATININE CLEARANCE, URINE, 24 HOUR
Collection Interval-CRCL: 24 hours
Creatinine Clearance: 18 mL/min — ABNORMAL LOW (ref 75–115)
Creatinine, 24H Ur: 720 mg/d (ref 600–1800)
Creatinine, Urine: 29.38 mg/dL
Urine Total Volume-CRCL: 2450 mL

## 2019-09-05 LAB — BPAM RBC
Blood Product Expiration Date: 202011052359
Blood Product Expiration Date: 202011072359
ISSUE DATE / TIME: 202010251210
ISSUE DATE / TIME: 202010251627
Unit Type and Rh: 6200
Unit Type and Rh: 6200

## 2019-09-05 SURGERY — ECHOCARDIOGRAM, TRANSESOPHAGEAL
Anesthesia: Monitor Anesthesia Care

## 2019-09-05 MED ORDER — PIPERACILLIN-TAZOBACTAM 3.375 G IVPB
3.3750 g | Freq: Three times a day (TID) | INTRAVENOUS | Status: DC
  Administered 2019-09-05 – 2019-09-07 (×7): 3.375 g via INTRAVENOUS
  Filled 2019-09-05 (×9): qty 50

## 2019-09-05 MED ORDER — CEFAZOLIN SODIUM-DEXTROSE 2-4 GM/100ML-% IV SOLN: 2.0000 g | Freq: Two times a day (BID) | INTRAVENOUS | Status: DC

## 2019-09-05 MED ORDER — HYDRALAZINE HCL 10 MG PO TABS
10.0000 mg | ORAL_TABLET | Freq: Four times a day (QID) | ORAL | Status: DC | PRN
  Administered 2019-09-05 – 2019-09-07 (×5): 10 mg via ORAL
  Filled 2019-09-05 (×5): qty 1

## 2019-09-05 MED ORDER — PROPOFOL 500 MG/50ML IV EMUL
INTRAVENOUS | Status: DC | PRN
  Administered 2019-09-05: 100 ug/kg/min via INTRAVENOUS

## 2019-09-05 MED ORDER — PROPOFOL 10 MG/ML IV BOLUS
INTRAVENOUS | Status: DC | PRN
  Administered 2019-09-05: 30 mg via INTRAVENOUS
  Administered 2019-09-05 (×2): 20 mg via INTRAVENOUS

## 2019-09-05 MED ORDER — FENTANYL CITRATE (PF) 100 MCG/2ML IJ SOLN
25.0000 ug | Freq: Once | INTRAMUSCULAR | Status: AC
  Administered 2019-09-05: 25 ug via INTRAVENOUS
  Filled 2019-09-05: qty 2

## 2019-09-05 NOTE — Anesthesia Preprocedure Evaluation (Signed)
Anesthesia Evaluation  Patient identified by MRN, date of birth, ID band Patient awake    Reviewed: Allergy & Precautions, H&P , NPO status , Patient's Chart, lab work & pertinent test results  Airway Mallampati: II   Neck ROM: full    Dental   Pulmonary Current Smoker,    breath sounds clear to auscultation       Cardiovascular hypertension, +CHF   Rhythm:regular Rate:Normal     Neuro/Psych    GI/Hepatic   Endo/Other  diabetes, Type 2  Renal/GU Renal InsufficiencyRenal disease     Musculoskeletal   Abdominal   Peds  Hematology  (+) Blood dyscrasia, anemia ,   Anesthesia Other Findings   Reproductive/Obstetrics                             Anesthesia Physical Anesthesia Plan  ASA: III  Anesthesia Plan: MAC   Post-op Pain Management:    Induction: Intravenous  PONV Risk Score and Plan: 1 and Propofol infusion and Treatment may vary due to age or medical condition  Airway Management Planned: Nasal Cannula  Additional Equipment:   Intra-op Plan:   Post-operative Plan:   Informed Consent: I have reviewed the patients History and Physical, chart, labs and discussed the procedure including the risks, benefits and alternatives for the proposed anesthesia with the patient or authorized representative who has indicated his/her understanding and acceptance.       Plan Discussed with: CRNA, Anesthesiologist and Surgeon  Anesthesia Plan Comments:         Anesthesia Quick Evaluation

## 2019-09-05 NOTE — Transfer of Care (Signed)
Immediate Anesthesia Transfer of Care Note  Patient: Shawna Martinez  Procedure(s) Performed: TRANSESOPHAGEAL ECHOCARDIOGRAM (TEE) (N/A )  Patient Location: Endoscopy Unit  Anesthesia Type:MAC  Level of Consciousness: drowsy and patient cooperative  Airway & Oxygen Therapy: Patient Spontanous Breathing and Patient connected to nasal cannula oxygen  Post-op Assessment: Report given to RN, Post -op Vital signs reviewed and stable and Patient moving all extremities X 4  Post vital signs: Reviewed and stable  Last Vitals:  Vitals Value Taken Time  BP    Temp    Pulse 65 09/05/19 1411  Resp 10 09/05/19 1411  SpO2 92 % 09/05/19 1411  Vitals shown include unvalidated device data.  Last Pain:  Vitals:   09/05/19 1332  TempSrc: Temporal  PainSc: 4       Patients Stated Pain Goal: 0 (46/96/29 5284)  Complications: No apparent anesthesia complications

## 2019-09-05 NOTE — TOC Initial Note (Signed)
Transition of Care [facility]) - Initial/Assessment Note    Patient Details  Name: Shawna Martinez MRN: [id number] Date of Birth: 1976/08/04  Transition of Care [facility]) CM/SW Contact:    Leone Haven, RN Phone Number: 09/05/2019, 4:31 PM  Clinical Narrative:                 From home with her 41 yo daughter who works, Patient admitted from [facility].  Patient states she would like to go back to [place] , her clothes are there .  NCM spoke with Geraldine Contras at [facility] and she states to let her know when patient is ready to come back so she will have a bed for her.  Patient will be on Zosyn IV for 3 more weeks .  Patient gave NCM permission to fax information to [facility].  Expected Discharge Plan: Skilled Nursing Facility Barriers to Discharge: No Barriers Identified   Patient Goals and CMS Choice Patient states their goals for this hospitalization and ongoing recovery are:: go back to SNF CMS Medicare.gov Compare Post Acute Care list provided to:: Patient Choice offered to / list presented to : Patient  Expected Discharge Plan and Services Expected Discharge Plan: Skilled Nursing Facility In-house Referral: NA Discharge Planning Services: CM Consult Post Acute Care Choice: Skilled Nursing Facility Living arrangements for the past 2 months: Single Family Home                 DME Arranged: (NA)         HH Arranged: NA          Prior Living Arrangements/Services Living arrangements for the past 2 months: Single Family Home Lives with:: Adult Children Patient language and need for interpreter reviewed:: Yes Do you feel safe going back to the place where you live?: Yes      Need for Family Participation in Patient Care: Yes (Comment) Care giver support system in place?: Yes (comment)   Criminal Activity/Legal Involvement Pertinent to Current Situation/Hospitalization: No - Comment as needed  Activities of Daily Living Home Assistive  Devices/Equipment: None ADL Screening (condition at time of admission) Patient's cognitive ability adequate to safely complete daily activities?: Yes Is the patient deaf or have difficulty hearing?: No Does the patient have difficulty seeing, even when wearing glasses/contacts?: No Does the patient have difficulty concentrating, remembering, or making decisions?: No Patient able to express need for assistance with ADLs?: Yes Does the patient have difficulty dressing or bathing?: No Independently performs ADLs?: Yes (appropriate for developmental age) Does the patient have difficulty walking or climbing stairs?: No Weakness of Legs: None Weakness of Arms/Hands: None  Permission Sought/Granted   Permission granted to share information with : Yes, Verbal Permission Granted  Share Information with NAME: Paulo Fruit  Permission granted to share info w AGENCY: SNF's  Permission granted to share info w Relationship: Paulo Fruit  Permission granted to share info w Contact Information: [phone]  Emotional Assessment Appearance:: Appears stated age Attitude/Demeanor/Rapport: Gracious Affect (typically observed): Appropriate Orientation: : Oriented to Self, Oriented to Place, Oriented to  Time, Oriented to Situation Alcohol / Substance Use: Not Applicable Psych Involvement: No (comment)  Admission diagnosis:  ENDOCARDITIS Patient Active Problem List   Diagnosis Date Noted  . Respiratory failure, acute (HCC) 09/03/2019  . HTN (hypertension) 09/03/2019  . CKD (chronic kidney disease), stage III 09/03/2019  . Osteomyelitis of right foot (HCC) 09/03/2019  . 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  Acute diastolic CHF (congestive heart failure) ([place]) 09/03/2019  . Anemia due to chronic kidney disease 09/03/2019  . 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 Endocarditis 09/03/2019   PCP:  Bonnita Nasuti, MD Pharmacy:  No Pharmacies  Listed    Social Determinants of Health (SDOH) Interventions    Readmission Risk Interventions Readmission Risk Prevention Plan 09/05/2019  Transportation Screening Complete  PCP or Specialist Appt within 5-7 Days Complete  Home Care Screening Complete  Medication Review (RN CM) Complete  Some recent data might be hidden

## 2019-09-05 NOTE — Progress Notes (Signed)
  Echocardiogram Echocardiogram Transesophageal has been performed.  Shawna Martinez L Androw 09/05/2019, 2:25 PM

## 2019-09-05 NOTE — Evaluation (Signed)
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  She reports living with her daughter, but completing ADLs with independence (sponge bathing only), using RW for mobility, limited iADLs. Pt currently requires min guard for LB ADLs, transfers and in room mobility using RW for safety/balance.  She will benefit from continued OT services while admitted and after dc at SNF level rehab in order to maximize independence and safety with ADLs, IADLs and mobility.     Follow Up Recommendations  SNF;Supervision/Assistance - 24 hour    Equipment Recommendations  None recommended by OT    Recommendations for Other Services       Precautions / Restrictions Precautions Precautions: Fall Required Braces or Orthoses: Other Brace Other Brace: bil Darco shoes Restrictions Weight Bearing Restrictions: No      Mobility Bed Mobility Overal bed mobility: Modified Independent             General bed mobility comments: no assist required   Transfers Overall transfer level: Needs assistance Equipment used: Rolling walker (2 wheeled) Transfers: Sit to/from Stand Sit to Stand: Min guard         General transfer comment: min guard for safety and balance with cueing for hand placement     Balance Overall balance assessment: Needs assistance Sitting-balance support: No upper extremity supported;Feet supported Sitting balance-Leahy Scale: Good     Standing balance support: Bilateral upper extremity supported;No upper extremity  supported;During functional activity Standing balance-Leahy Scale: Poor Standing balance comment: relaint on B UE support dynamically                           ADL either performed or assessed with clinical judgement   ADL Overall ADL's : Needs assistance/impaired     Grooming: Min guard;Standing   Upper Body Bathing: Set up;Sitting   Lower Body Bathing: Min guard;Sit to/from stand   Upper Body Dressing : Set up;Sitting   Lower Body Dressing: Min guard;Sit to/from stand Lower Body Dressing Details (indicate cue type and reason): able to don/doff bil darco shoes with setup, min guard sit<>stand  Toilet Transfer: Min guard;Ambulation;RW           Functional mobility during ADLs: Min guard;Rolling walker General ADL Comments: min guard for safety/balance, limited by genearlized weakness, balance      Vision Baseline Vision/History: Retinopathy Patient Visual Report: No change from baseline Additional Comments: blurred L eye at baseline, appears [facility]      Perception     Praxis      Pertinent Vitals/Pain Pain Assessment: Faces Faces Pain Scale: Hurts a little bit Pain Location: bil LE with ambulation Pain Descriptors / Indicators: Aching;Sore Pain Intervention(s): Monitored during session;Repositioned     Hand Dominance     Extremity/Trunk Assessment Upper Extremity Assessment Upper Extremity Assessment: Generalized weakness;RUE deficits/detail;LUE deficits/detail RUE Sensation: history of peripheral neuropathy LUE Sensation: history of peripheral neuropathy   Lower Extremity Assessment Lower Extremity Assessment: Defer to PT evaluation   Cervical / Trunk Assessment Cervical / Trunk Assessment: Normal   Communication Communication Communication: No difficulties  Cognition Arousal/Alertness: Awake/alert Behavior During Therapy: WFL for tasks assessed/performed Overall Cognitive Status: Within Functional Limits for tasks assessed                                      General Comments       Exercises     Shoulder Instructions      Home Living Family/patient expects to be discharged to:: Private residence Living Arrangements: Children Available Help at Discharge: Family Type of Home: Apartment Home Access: Stairs to enter Technical brewer of Steps: 3   Home Layout: Two level     Bathroom Shower/Tub: Teacher, early years/pre: Standard     Home Equipment: Environmental consultant - 2 wheels;Bedside commode   Additional Comments: daughter currently unemployed      Prior Functioning/Environment Level of Independence: Independent with assistive device(s)        Comments: uses walker off and on since July, ADLs with increased time and effort but independnet (sponge bathing only), limited iADLS         OT Problem List: Decreased strength;Impaired balance (sitting and/or standing);Decreased knowledge of use of DME or AE;Pain      OT Treatment/Interventions: Self-care/ADL training;DME and/or AE instruction;Patient/family education;Balance training;Therapeutic activities;Therapeutic exercise    OT Goals(Current goals can be found in the care plan section) Acute Rehab OT Goals Patient Stated Goal: be able to walk and return home after rehab OT Goal Formulation: With patient Time For Goal Achievement: 09/19/19 Potential to Achieve Goals: Good  OT Frequency: Min 2X/week   Barriers to D/C:            Co-evaluation              AM-PAC OT "6 Clicks" Daily Activity     Outcome Measure Help from another person eating meals?: None Help from another person taking care of personal grooming?: A Little Help from another person toileting, which includes using toliet, bedpan, or urinal?: A Little Help from another person bathing (including washing, rinsing, drying)?: A Little Help from another person to put on and taking off regular upper body clothing?: A Little Help from another person to put on and  taking off regular lower body clothing?: A Little 6 Click Score: 19   End of Session Equipment Utilized During Treatment: Rolling walker Nurse Communication: Mobility status  Activity Tolerance: Patient tolerated treatment well Patient left: with call bell/phone within reach;in bed  OT Visit Diagnosis: Unsteadiness on feet (R26.81);Muscle weakness (generalized) (M62.81)                Time: 9604-5409 OT Time Calculation (min): 20 min Charges:  OT General Charges $OT Visit: 1 Visit OT Evaluation $OT Eval Moderate Complexity: [place], OT Acute Rehabilitation Services Pager [phone] Office [phone]   Delight Stare 09/05/2019, 1:01 PM

## 2019-09-05 NOTE — Progress Notes (Addendum)
Pharmacy Antibiotic Note  Shawna Martinez is a 43 y.o. female admitted on 09/03/2019 with a wound infection.   Pharmacy had been consulted for vancomycin and zosyn dosing, then transitioned to PTA regimen of cefazolin 2g IV TID.  ID consulted, recommends changing to Zosyn.   Plan: -Zosyn 3.375g IV EI q8h -Stop Ancef    Height: 5\' 4"  (162.6 cm) Weight: 184 lb 8 oz (83.7 kg) IBW/kg (Calculated) : 54.7  Temp (24hrs), Avg:98.5 F (36.9 C), Min:98.1 F (36.7 C), Max:98.8 F (37.1 C)  Recent Labs  Lab 09/03/19 1848 09/04/19 0420 09/05/19 0327  WBC 14.1* 12.2* 10.7*  CREATININE 2.50* 2.64* 2.79*  VANCORANDOM  --  53*  --     Estimated Creatinine Clearance: 27.8 mL/min (A) (by C-G formula based on SCr of 2.79 mg/dL (H)).    Allergies  Allergen Reactions  . Amitriptyline Other (See Comments)    agitation  . Asenapine Anxiety and Other (See Comments)    Seizure-like episodes   . Atorvastatin Other (See Comments)    Inability to walk. Memory impairment.  . Carbamazepine Swelling and Other (See Comments)    Abnormal feelings.     . Nortriptyline Swelling  . Sulfa Antibiotics Nausea And Vomiting  . Sulfamethoxazole-Trimethoprim Nausea And Vomiting  . Sulfasalazine Nausea And Vomiting  . Statins Other (See Comments)    Affect motors skills.     Antimicrobials this admission: Vancomycin 10/24 >> 10/25 Zosyn 10/24 >> 10/25; 10/26 >> Cefazolin 10/25 >> 10/26  Microbiology results: 10/24 blood x2: NGTD  Thank you for allowing pharmacy to be a part of this patient's care.  Arrie Senate, PharmD, BCPS Clinical Pharmacist [phone] Please check AMION for all [place] numbers 09/05/2019

## 2019-09-05 NOTE — NC FL2 (Signed)
[place] MEDICAID FL2 LEVEL OF CARE SCREENING TOOL     IDENTIFICATION  Patient Name: Shawna Martinez Birthdate: 02/08/1976 Sex: female Admission Date (Current Location): 09/03/2019  [facility] and [place] Number:  Producer, television/film/video and Address:  [facility] [place], [address] [place], [place] [address]      Provider Number: [id number]  Attending Physician Name and Address:  Noralee Stain, DO  Relative Name and Phone Number:  Paulo Fruit [phone]    Current Level of Care: Hospital Recommended Level of Care: Skilled Nursing Facility Prior Approval Number:    Date Approved/Denied:   PASRR Number: [id number]  Discharge Plan: SNF    Current Diagnoses: Patient Active Problem List   Diagnosis Date Noted  . Respiratory failure, acute (HCC) 09/03/2019  . HTN (hypertension) 09/03/2019  . CKD (chronic kidney disease), stage III 09/03/2019  . Osteomyelitis of right foot (HCC) 09/03/2019  . 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 Acute diastolic CHF (congestive heart failure) (HCC) 09/03/2019  . Anemia due to chronic kidney disease 09/03/2019  . 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 Endocarditis 09/03/2019    Orientation RESPIRATION BLADDER Height & Weight     Self, Time, Situation, Place  Normal Continent Weight: 83.7 kg Height:  5\' 4"  (162.6 cm)  BEHAVIORAL SYMPTOMS/MOOD NEUROLOGICAL BOWEL NUTRITION STATUS      Continent Diet(See DC summary)  AMBULATORY STATUS COMMUNICATION OF NEEDS Skin   Limited Assist Verbally (Right foot recent amputation, left foot wound with wet to dry dressing)                       Personal Care Assistance Level of Assistance  Bathing, Feeding, Dressing Bathing Assistance: Limited assistance Feeding assistance: Limited assistance Dressing Assistance: Limited assistance     Functional Limitations Info  Sight, Hearing, Speech Sight Info: Adequate Hearing Info:  Adequate Speech Info: Adequate    SPECIAL CARE FACTORS FREQUENCY  PT (By licensed PT), OT (By licensed OT)     PT Frequency: 5x/week OT Frequency: 5x/week            Contractures Contractures Info: Not present    Additional Factors Info  Code Status, Allergies Code Status Info: Full Allergies Info: Amitriptyline, Asenapine, Atorvastatin, Carbamazepine, Nortriptyline, Sulfa Antibiotics, Sulfamethoxazole-trimethoprim, Sulfasalazine, Statins           Current Medications (09/05/2019):  This is the current hospital active medication list Current Facility-Administered Medications  Medication Dose Route Frequency Provider Last Rate Last Dose  . 0.9 %  sodium chloride infusion  250 mL Intravenous PRN Nahser, 09/07/2019, MD      . acetaminophen (TYLENOL) tablet 650 mg  650 mg Oral Q6H PRN Nahser, Deloris Ping, MD       Or  . acetaminophen (TYLENOL) suppository 650 mg  650 mg Rectal Q6H PRN Nahser, Deloris Ping, MD      . amLODipine (NORVASC) tablet 10 mg  10 mg Oral Daily Nahser, Deloris Ping, MD   10 mg at 09/05/19 0957  . aspirin EC tablet 81 mg  81 mg Oral Daily Nahser, 09/07/19, MD   81 mg at 09/05/19 0959  . carvedilol (COREG) tablet 12.5 mg  12.5 mg Oral BID WC Nahser, 09/07/19, MD   Stopped at 09/04/19 2138  . Chlorhexidine Gluconate Cloth 2 % PADS 6 each  6 each Topical Daily Nahser, 2139, MD   6 each at 09/04/19 1055  . docusate sodium (  COLACE) capsule 100 mg  100 mg Oral BID Nahser, Wonda Cheng, MD   100 mg at 09/05/19 0957  . ferrous sulfate tablet 325 mg  325 mg Oral BID WC Nahser, Wonda Cheng, MD   325 mg at 09/05/19 0716  . gabapentin (NEURONTIN) capsule 800 mg  800 mg Oral TID Nahser, Wonda Cheng, MD   800 mg at 09/05/19 0958  . heparin injection 5,000 Units  5,000 Units Subcutaneous Q8H Nahser, Wonda Cheng, MD   5,000 Units at 09/05/19 1002  . hydrALAZINE (APRESOLINE) tablet 10 mg  10 mg Oral Q6H PRN Dessa Phi, DO      . insulin aspart (novoLOG) injection 0-9 Units  0-9 Units  Subcutaneous TID WC Nahser, Wonda Cheng, MD   1 Units at 09/04/19 1706  . insulin glargine (LANTUS) injection 15 Units  15 Units Subcutaneous QHS Nahser, Wonda Cheng, MD   15 Units at 09/04/19 2125  . magnesium hydroxide (MILK OF MAGNESIA) suspension 30 mL  30 mL Oral Daily PRN Nahser, Wonda Cheng, MD      . ondansetron [facility]) tablet 4 mg  4 mg Oral Q6H PRN Nahser, Wonda Cheng, MD       Or  . ondansetron [facility]) injection 4 mg  4 mg Intravenous Q6H PRN Nahser, Wonda Cheng, MD      . oxyCODONE (Oxy IR/ROXICODONE) immediate release tablet 5 mg  5 mg Oral Q6H PRN Nahser, Wonda Cheng, MD   5 mg at 09/05/19 1538  . pantoprazole (PROTONIX) EC tablet 40 mg  40 mg Oral BID Nahser, Wonda Cheng, MD   40 mg at 09/05/19 0958  . piperacillin-tazobactam (ZOSYN) IVPB 3.375 g  3.375 g Intravenous Q8H Nahser, Wonda Cheng, MD      . senna [facility]) tablet 8.6 mg  1 tablet Oral BID Nahser, Wonda Cheng, MD   8.6 mg at 09/05/19 0958  . sodium chloride flush (NS) 0.9 % injection 10-40 mL  10-40 mL Intracatheter Q12H Nahser, Wonda Cheng, MD   10 mL at 09/04/19 2131  . sodium chloride flush (NS) 0.9 % injection 10-40 mL  10-40 mL Intracatheter PRN Nahser, Wonda Cheng, MD      . sodium chloride flush (NS) 0.9 % injection 3 mL  3 mL Intravenous Q12H Nahser, Wonda Cheng, MD   3 mL at 09/04/19 1055  . sodium chloride flush (NS) 0.9 % injection 3 mL  3 mL Intravenous PRN Nahser, Wonda Cheng, MD         Discharge Medications: Please see discharge summary for a list of discharge medications.  Relevant Imaging Results:  Relevant Lab Results:   Additional Information [place]  Zenon Mayo, RN

## 2019-09-05 NOTE — H&P (View-Only) (Signed)
PROGRESS NOTE    Shawna Martinez  UQJ:[id number] DOB: 27-Dec-1975 DOA: 09/03/2019 PCP: Bonnita Nasuti, MD     Brief Narrative:  Shawna Martinez is a 43 y.o. female with past medical history significant for diabetes type 2 insulin-dependent, diabetic neuropathy, hypertension, diastolic heart failure, stage III chronic kidney disease prior creatinine levels 1.5-1.7 (05-2019), prior history of osteomyelitis of the right ankle and foot and recently underwent right great toe amputation (on October 17). Patient was recently hospitalized at [facility] for surgery and discharged to skilled nursing facility on 10/21.  She was discharged on IV Zosyn to treat underlying osteomyelitis with recommendation to complete 4 weeks of IV antibiotics.  Patient's home dose lasix was held at discharge due to AKI on chronic kidney disease. Patient presented to [facility] emergency department on 09/02/2019 with worsening shortness of breath and acute hypoxic respiratory failure requiring 4 L of oxygen.  Chest x-ray show pulmonary edema or multifocal pneumonia with possible small left pleural effusion.  CTA was negative for PE finding concerning with heart failure versus pneumonia.  During evaluation for heart failure exacerbation patient had 2D echo performed that showed preserved ejection fraction of 70%, possible small vegetation on the cusp of the aortic valve suggestive of endocarditis. Case was discussed with cardiology who recommended patient to be transferred to [facility] con for definitive study, TEE. Patient was treated with IV Lasix 40 mg IV twice daily, with improvement of symptoms. IV Zosyn and IV vancomycin continued for presumed endocarditis.  New events last 24 hours / Subjective: Feeling well this morning, complains of constipation, breathing stable and on room air without distress  Assessment & Plan:   Active Problems:   Respiratory failure, acute (HCC)   HTN (hypertension)   CKD (chronic kidney  disease), stage III   Osteomyelitis of right foot (HCC)   Type 2 diabetes mellitus with proliferative retinopathy (HCC)   Acute diastolic CHF (congestive heart failure) (HCC)   Anemia due to chronic kidney disease   Infective endocarditis   Endocarditis   Acute hypoxemic respiratory failure secondary to acute on chronic diastolic heart failure -Patient received IV Lasix at [facility] prior to transfer -Chest x-ray revealed mild cardiomegaly without edema or consolidation -IV Lasix further held due to worsening kidney function -Now on room air  Possible aortic valve vegetation, infective endocarditis -Echocardiogram completed at [facility] revealed possible small vegetation on the cusp of the aortic valve suggestive of endocarditis -Blood cultures negative to date -TEE scheduled for today 10/26 -ID following  -Zosyn   AKI on CKD stage III -Baseline creatinine 1.5-1.79 -Creatinine at [facility] on 10/23 was 1.8 -Creatinine trending upward due to IV Lasix, vancomycin -Avoid nephrotoxin -Patient received one-time Lasix 10/25 due to blood transfusions.  Hold off on further diuretics and watch creatinine closely  Type 2 diabetes, insulin-dependent, with neuropathy -Hemoglobin A1c 7.3 -Continue Lantus, sliding scale insulin -Continue gabapentin  Hypertension -Continue Norvasc, Coreg.  Lisinopril on hold due to AKI  Osteomyelitis of right foot, ankle with recent right great toe amputation -Patient had been scheduled for IV Zosyn for 4 weeks   Anemia of chronic kidney disease -Baseline hemoglobin around 7-7.6 -No signs of acute blood loss -Continue ferrous sulfate -Transfused 2 unit packed red blood cell 10/25.  Hemoglobin stable this morning   DVT prophylaxis: Subcutaneous heparin Code Status: Full code Family Communication: None at bedside Disposition Plan: Pending further evaluation of TEE, improvement in creatinine.  Plan for SNF discharge when  stable  Consultants:   Infectious disease called at the time of admission  Cardiology for TEE scheduling  Procedures:   None  Antimicrobials:  Anti-infectives (From admission, onward)   Start     Dose/Rate Route Frequency Ordered Stop   09/05/19 2200  ceFAZolin (ANCEF) IVPB 2g/100 mL premix  Status:  Discontinued     2 g 200 mL/hr over 30 Minutes Intravenous Every 12 hours 09/05/19 0731 09/05/19 0957   09/05/19 1400  piperacillin-tazobactam (ZOSYN) IVPB 3.375 g     3.375 g 12.5 mL/hr over 240 Minutes Intravenous Every 8 hours 09/05/19 1005     09/04/19 1500  ceFAZolin (ANCEF) IVPB 2g/100 mL premix  Status:  Discontinued     2 g 200 mL/hr over 30 Minutes Intravenous Every 8 hours 09/04/19 1458 09/05/19 0731   09/04/19 0100  piperacillin-tazobactam (ZOSYN) IVPB 3.375 g  Status:  Discontinued     3.375 g 12.5 mL/hr over 240 Minutes Intravenous Every 8 hours 09/03/19 1746 09/04/19 1403   09/03/19 2014  vancomycin variable dose per unstable renal function (pharmacist dosing)  Status:  Discontinued      Does not apply See admin instructions 09/03/19 2014 09/04/19 1403   09/03/19 1830  vancomycin (VANCOCIN) 1,750 mg in sodium chloride 0.9 % 500 mL IVPB  Status:  Discontinued     1,750 mg 250 mL/hr over 120 Minutes Intravenous  Once 09/03/19 1746 09/03/19 2236   09/03/19 1800  piperacillin-tazobactam (ZOSYN) IVPB 3.375 g     3.375 g 100 mL/hr over 30 Minutes Intravenous  Once 09/03/19 1746 09/03/19 2335       Objective: Vitals:   09/04/19 2000 09/05/19 0455 09/05/19 0945 09/05/19 0955  BP: (!) 163/61 (!) 171/70 (!) 193/71 (!) 188/65  Pulse: 63 64    Resp: 16 14    Temp: 98.5 F (36.9 C) 98.4 F (36.9 C) 98.7 F (37.1 C)   TempSrc: Oral Oral Oral   SpO2: 93% 95% 97%   Weight:  83.7 kg    Height:        Intake/Output Summary (Last 24 hours) at 09/05/2019 1039 Last data filed at 09/05/2019 0716 Gross per 24 hour  Intake 1750 ml  Output 3100 ml  Net -1350 ml    Filed Weights   09/03/19 1500 09/04/19 0612 09/05/19 0455  Weight: 85.5 kg 85.4 kg 83.7 kg    Examination: General exam: Appears calm and comfortable  Respiratory system: Clear to auscultation. Respiratory effort normal.  On room air Cardiovascular system: S1 & S2 heard, RRR. No pedal edema. Gastrointestinal system: Abdomen is nondistended, soft and nontender. Normal bowel sounds heard. Central nervous system: Alert and oriented. Non focal exam. Speech clear  Psychiatry: Judgement and insight appear stable. Mood & affect appropriate.    Data Reviewed: I have personally reviewed following labs and imaging studies  CBC: Recent Labs  Lab 09/03/19 1848 09/04/19 0420 09/05/19 0327  WBC 14.1* 12.2* 10.7*  HGB 7.8* 6.7* 9.1*  HCT 23.9* 21.1* 27.2*  MCV 81.8 83.7 84.2  PLT 282 232 245   Basic Metabolic Panel: Recent Labs  Lab 09/03/19 1848 09/04/19 0420 09/05/19 0327  NA 133* 134* 138  K 4.5 4.3 4.0  CL 102 103 107  CO2 19* 23 23  GLUCOSE 121* 133* 101*  BUN 38* 36* 36*  CREATININE 2.50* 2.64* 2.79*  CALCIUM 8.5* 7.9* 8.0*   GFR: Estimated Creatinine Clearance: 27.8 mL/min (A) (by C-G formula based on SCr of 2.79 mg/dL (H)). Liver Function Tests: Recent  Labs  Lab 09/03/19 1848  AST 16  ALT 13  ALKPHOS 84  BILITOT 0.4  PROT 7.1  ALBUMIN 2.7*   No results for input(s): LIPASE, AMYLASE in the last 168 hours. No results for input(s): AMMONIA in the last 168 hours. Coagulation Profile: No results for input(s): INR, PROTIME in the last 168 hours. Cardiac Enzymes: No results for input(s): CKTOTAL, CKMB, CKMBINDEX, TROPONINI in the last 168 hours. BNP (last 3 results) No results for input(s): PROBNP in the last 8760 hours. HbA1C: Recent Labs    09/03/19 1848  HGBA1C 7.3*   CBG: Recent Labs  Lab 09/04/19 0618 09/04/19 1157 09/04/19 1639 09/04/19 2117 09/05/19 0619  GLUCAP 126* 97 126* 153* 90   Lipid Profile: No results for input(s): CHOL, HDL,  LDLCALC, TRIG, CHOLHDL, LDLDIRECT in the last 72 hours. Thyroid Function Tests: No results for input(s): TSH, T4TOTAL, FREET4, T3FREE, THYROIDAB in the last 72 hours. Anemia Panel: Recent Labs    09/04/19 0420  VITAMINB12 1,141*  FOLATE 7.8  FERRITIN 57  TIBC 230*  IRON 32  RETICCTPCT 3.6*   Sepsis Labs: No results for input(s): PROCALCITON, LATICACIDVEN in the last 168 hours.  Recent Results (from the past 240 hour(s))  Culture, blood (routine x 2)     Status: None (Preliminary result)   Collection Time: 09/03/19  6:45 PM   Specimen: BLOOD LEFT WRIST  Result Value Ref Range Status   Specimen Description BLOOD LEFT WRIST  Final   Special Requests   Final    BOTTLES DRAWN AEROBIC ONLY Blood Culture results may not be optimal due to an inadequate volume of blood received in culture bottles   Culture   Final    NO GROWTH 2 DAYS Performed at [facility], [address], [place], [place] [address]    Report Status PENDING  Incomplete  Culture, blood (routine x 2)     Status: None (Preliminary result)   Collection Time: 09/03/19  6:50 PM   Specimen: BLOOD LEFT ARM  Result Value Ref Range Status   Specimen Description BLOOD LEFT ARM  Final   Special Requests   Final    BOTTLES DRAWN AEROBIC AND ANAEROBIC Blood Culture adequate volume   Culture   Final    NO GROWTH 2 DAYS Performed at [facility], [address], [place], [place] [address]    Report Status PENDING  Incomplete      Radiology Studies: Dg Chest 2 View  Result Date: 09/04/2019 CLINICAL DATA:  Wound infection EXAM: CHEST - 2 VIEW COMPARISON:  September 02, 2019 FINDINGS: There is slight atelectasis in the right mid lung. The lungs elsewhere are clear. Heart is mildly enlarged with pulmonary vascularity. No adenopathy. Central catheter tip is at the cavoatrial junction. No pneumothorax. No bone lesions. IMPRESSION: Mild cardiomegaly. Pulmonary vascularity normal. No adenopathy. Mild atelectasis right  mid lung. No edema or consolidation. Electronically Signed   By: Bretta Bang III M.D.   On: 09/04/2019 08:57   Dg Abd 1 View  Result Date: 09/04/2019 CLINICAL DATA:  Wound infection and constipation EXAM: ABDOMEN - 1 VIEW COMPARISON:  None. FINDINGS: There is diffuse stool throughout colon. There is no bowel dilatation or air-fluid level to suggest bowel obstruction. No free air. There is a 2 mm calcification either in or overlying the medial upper left kidney. There are foci of iliac artery atherosclerosis bilaterally. IMPRESSION: Diffuse stool throughout colon, a finding indicative of constipation. No bowel obstruction or free  air. 2 mm calcification either in or overlying the medial upper pole left kidney. Electronically Signed   By: Bretta BangWilliam  Woodruff III M.D.   On: 09/04/2019 08:58      Scheduled Meds:  amLODipine  10 mg Oral Daily   aspirin EC  81 mg Oral Daily   carvedilol  12.5 mg Oral BID WC   Chlorhexidine Gluconate Cloth  6 each Topical Daily   docusate sodium  100 mg Oral BID   ferrous sulfate  325 mg Oral BID WC   gabapentin  800 mg Oral TID   heparin  5,000 Units Subcutaneous Q8H   insulin aspart  0-9 Units Subcutaneous TID WC   insulin glargine  15 Units Subcutaneous QHS   pantoprazole  40 mg Oral BID   senna  1 tablet Oral BID   sodium chloride flush  10-40 mL Intracatheter Q12H   sodium chloride flush  3 mL Intravenous Q12H   Continuous Infusions:  sodium chloride     sodium chloride 20 mL/hr at 09/04/19 1822   piperacillin-tazobactam (ZOSYN)  IV       LOS: 2 days      Time spent: 25 minutes   Noralee StainJennifer Amarius Toto, DO [facility] 09/05/2019, 10:39 AM   Available via Epic secure chat 7am-7pm After these hours, please refer to coverage provider listed on [email or web address]

## 2019-09-05 NOTE — CV Procedure (Signed)
    Transesophageal Echocardiogram Note  Shawna Martinez [id number] Mar 12, 1976  Procedure: Transesophageal Echocardiogram Indications: bacteremia   Procedure Details Consent: Obtained Time Out: Verified patient identification, verified procedure, site/side was marked, verified correct patient position, special equipment/implants available, Radiology Safety Procedures followed,  medications/allergies/relevent history reviewed, required imaging and test results available.  Performed  Medications:  During this procedure the patient is administered a propofol drip by CRNA, Aldona Bar.  Total propofol was 180 mg IV   Left Ventrical:  Normal LV function   Mitral Valve: normal,  No vegetation   Aortic Valve: normal , no vegetation   Tricuspid Valve: normal , no vegetation   Pulmonic Valve:  Normal , no vegetation   Left Atrium/ Left atrial appendage: normal , no thrombi   Atrial septum:  No ASD or PFO   Aorta: normal .    Complications: No apparent complications Patient did tolerate procedure well.   Thayer Headings, Brooke Bonito., MD, [facility] 09/05/2019, 2:07 PM

## 2019-09-05 NOTE — Interval H&P Note (Signed)
History and Physical Interval Note:  09/05/2019 1:34 PM  Shawna Martinez  has presented today for surgery, with the diagnosis of bacteremia.  The various methods of treatment have been discussed with the patient and family. After consideration of risks, benefits and other options for treatment, the patient has consented to  Procedure(s): TRANSESOPHAGEAL ECHOCARDIOGRAM (TEE) (N/A) as a surgical intervention.  The patient's history has been reviewed, patient examined, no change in status, stable for surgery.  I have reviewed the patient's chart and labs.  Questions were answered to the patient's satisfaction.     [name]

## 2019-09-05 NOTE — Progress Notes (Signed)
PROGRESS NOTE    Shawna Martinez  UQJ:[id number] DOB: 27-Dec-1975 DOA: 09/03/2019 PCP: Bonnita Nasuti, MD     Brief Narrative:  Shawna Martinez is a 43 y.o. female with past medical history significant for diabetes type 2 insulin-dependent, diabetic neuropathy, hypertension, diastolic heart failure, stage III chronic kidney disease prior creatinine levels 1.5-1.7 (05-2019), prior history of osteomyelitis of the right ankle and foot and recently underwent right great toe amputation (on October 17). Patient was recently hospitalized at [facility] for surgery and discharged to skilled nursing facility on 10/21.  She was discharged on IV Zosyn to treat underlying osteomyelitis with recommendation to complete 4 weeks of IV antibiotics.  Patient's home dose lasix was held at discharge due to AKI on chronic kidney disease. Patient presented to [facility] emergency department on 09/02/2019 with worsening shortness of breath and acute hypoxic respiratory failure requiring 4 L of oxygen.  Chest x-ray show pulmonary edema or multifocal pneumonia with possible small left pleural effusion.  CTA was negative for PE finding concerning with heart failure versus pneumonia.  During evaluation for heart failure exacerbation patient had 2D echo performed that showed preserved ejection fraction of 70%, possible small vegetation on the cusp of the aortic valve suggestive of endocarditis. Case was discussed with cardiology who recommended patient to be transferred to [facility] con for definitive study, TEE. Patient was treated with IV Lasix 40 mg IV twice daily, with improvement of symptoms. IV Zosyn and IV vancomycin continued for presumed endocarditis.  New events last 24 hours / Subjective: Feeling well this morning, complains of constipation, breathing stable and on room air without distress  Assessment & Plan:   Active Problems:   Respiratory failure, acute (HCC)   HTN (hypertension)   CKD (chronic kidney  disease), stage III   Osteomyelitis of right foot (HCC)   Type 2 diabetes mellitus with proliferative retinopathy (HCC)   Acute diastolic CHF (congestive heart failure) (HCC)   Anemia due to chronic kidney disease   Infective endocarditis   Endocarditis   Acute hypoxemic respiratory failure secondary to acute on chronic diastolic heart failure -Patient received IV Lasix at [facility] prior to transfer -Chest x-ray revealed mild cardiomegaly without edema or consolidation -IV Lasix further held due to worsening kidney function -Now on room air  Possible aortic valve vegetation, infective endocarditis -Echocardiogram completed at [facility] revealed possible small vegetation on the cusp of the aortic valve suggestive of endocarditis -Blood cultures negative to date -TEE scheduled for today 10/26 -ID following  -Zosyn   AKI on CKD stage III -Baseline creatinine 1.5-1.79 -Creatinine at [facility] on 10/23 was 1.8 -Creatinine trending upward due to IV Lasix, vancomycin -Avoid nephrotoxin -Patient received one-time Lasix 10/25 due to blood transfusions.  Hold off on further diuretics and watch creatinine closely  Type 2 diabetes, insulin-dependent, with neuropathy -Hemoglobin A1c 7.3 -Continue Lantus, sliding scale insulin -Continue gabapentin  Hypertension -Continue Norvasc, Coreg.  Lisinopril on hold due to AKI  Osteomyelitis of right foot, ankle with recent right great toe amputation -Patient had been scheduled for IV Zosyn for 4 weeks   Anemia of chronic kidney disease -Baseline hemoglobin around 7-7.6 -No signs of acute blood loss -Continue ferrous sulfate -Transfused 2 unit packed red blood cell 10/25.  Hemoglobin stable this morning   DVT prophylaxis: Subcutaneous heparin Code Status: Full code Family Communication: None at bedside Disposition Plan: Pending further evaluation of TEE, improvement in creatinine.  Plan for SNF discharge when  stable  Consultants:  °· Infectious disease called at the time of admission °· Cardiology for TEE scheduling ° °Procedures:  °· None ° °Antimicrobials:  °Anti-infectives (From admission, onward)  ° Start     Dose/Rate Route Frequency Ordered Stop  ° 09/05/19 2200  ceFAZolin (ANCEF) IVPB 2g/100 mL premix  Status:  Discontinued    ° 2 g °200 mL/hr over 30 Minutes Intravenous Every 12 hours 09/05/19 0731 09/05/19 0957  ° 09/05/19 1400  piperacillin-tazobactam (ZOSYN) IVPB 3.375 g    ° 3.375 g °12.5 mL/hr over 240 Minutes Intravenous Every 8 hours 09/05/19 1005    ° 09/04/19 1500  ceFAZolin (ANCEF) IVPB 2g/100 mL premix  Status:  Discontinued    ° 2 g °200 mL/hr over 30 Minutes Intravenous Every 8 hours 09/04/19 1458 09/05/19 0731  ° 09/04/19 0100  piperacillin-tazobactam (ZOSYN) IVPB 3.375 g  Status:  Discontinued    ° 3.375 g °12.5 mL/hr over 240 Minutes Intravenous Every 8 hours 09/03/19 1746 09/04/19 1403  ° 09/03/19 2014  vancomycin variable dose per unstable renal function (pharmacist dosing)  Status:  Discontinued    °  Does not apply See admin instructions 09/03/19 2014 09/04/19 1403  ° 09/03/19 1830  vancomycin (VANCOCIN) 1,750 mg in sodium chloride 0.9 % 500 mL IVPB  Status:  Discontinued    ° 1,750 mg °250 mL/hr over 120 Minutes Intravenous  Once 09/03/19 1746 09/03/19 2236  ° 09/03/19 1800  piperacillin-tazobactam (ZOSYN) IVPB 3.375 g    ° 3.375 g °100 mL/hr over 30 Minutes Intravenous  Once 09/03/19 1746 09/03/19 2335  °  ° ° ° °Objective: °Vitals:  ° 09/04/19 2000 09/05/19 0455 09/05/19 0945 09/05/19 0955  °BP: (!) 163/61 (!) 171/70 (!) 193/71 (!) 188/65  °Pulse: 63 64    °Resp: 16 14    °Temp: 98.5 °F (36.9 °C) 98.4 °F (36.9 °C) 98.7 °F (37.1 °C)   °TempSrc: Oral Oral Oral   °SpO2: 93% 95% 97%   °Weight:  83.7 kg    °Height:      ° ° °Intake/Output Summary (Last 24 hours) at 09/05/2019 1039 °Last data filed at 09/05/2019 0716 °Gross per 24 hour  °Intake 1750 ml  °Output 3100 ml  °Net -1350 ml   ° °Filed Weights  ° 09/03/19 1500 09/04/19 0612 09/05/19 0455  °Weight: 85.5 kg 85.4 kg 83.7 kg  ° ° °Examination: °General exam: Appears calm and comfortable  °Respiratory system: Clear to auscultation. Respiratory effort normal.  On room air °Cardiovascular system: S1 & S2 heard, RRR. No pedal edema. °Gastrointestinal system: Abdomen is nondistended, soft and nontender. Normal bowel sounds heard. °Central nervous system: Alert and oriented. Non focal exam. Speech clear  °Psychiatry: Judgement and insight appear stable. Mood & affect appropriate.  ° ° °Data Reviewed: I have personally reviewed following labs and imaging studies ° °CBC: °Recent Labs  °Lab 09/03/19 °1848 09/04/19 °0420 09/05/19 °0327  °WBC 14.1* 12.2* 10.7*  °HGB 7.8* 6.7* 9.1*  °HCT 23.9* 21.1* 27.2*  °MCV 81.8 83.7 84.2  °PLT 282 232 245  ° °Basic Metabolic Panel: °Recent Labs  °Lab 09/03/19 °1848 09/04/19 °0420 09/05/19 °0327  °NA 133* 134* 138  °K 4.5 4.3 4.0  °CL 102 103 107  °CO2 19* 23 23  °GLUCOSE 121* 133* 101*  °BUN 38* 36* 36*  °CREATININE 2.50* 2.64* 2.79*  °CALCIUM 8.5* 7.9* 8.0*  ° °GFR: °Estimated Creatinine Clearance: 27.8 mL/min (A) (by C-G formula based on SCr of 2.79 mg/dL (H)). °Liver Function Tests: °Recent   Labs  Lab 09/03/19 1848  AST 16  ALT 13  ALKPHOS 84  BILITOT 0.4  PROT 7.1  ALBUMIN 2.7*   No results for input(s): LIPASE, AMYLASE in the last 168 hours. No results for input(s): AMMONIA in the last 168 hours. Coagulation Profile: No results for input(s): INR, PROTIME in the last 168 hours. Cardiac Enzymes: No results for input(s): CKTOTAL, CKMB, CKMBINDEX, TROPONINI in the last 168 hours. BNP (last 3 results) No results for input(s): PROBNP in the last 8760 hours. HbA1C: Recent Labs    09/03/19 1848  HGBA1C 7.3*   CBG: Recent Labs  Lab 09/04/19 0618 09/04/19 1157 09/04/19 1639 09/04/19 2117 09/05/19 0619  GLUCAP 126* 97 126* 153* 90   Lipid Profile: No results for input(s): CHOL, HDL,  LDLCALC, TRIG, CHOLHDL, LDLDIRECT in the last 72 hours. Thyroid Function Tests: No results for input(s): TSH, T4TOTAL, FREET4, T3FREE, THYROIDAB in the last 72 hours. Anemia Panel: Recent Labs    09/04/19 0420  VITAMINB12 1,141*  FOLATE 7.8  FERRITIN 57  TIBC 230*  IRON 32  RETICCTPCT 3.6*   Sepsis Labs: No results for input(s): PROCALCITON, LATICACIDVEN in the last 168 hours.  Recent Results (from the past 240 hour(s))  Culture, blood (routine x 2)     Status: None (Preliminary result)   Collection Time: 09/03/19  6:45 PM   Specimen: BLOOD LEFT WRIST  Result Value Ref Range Status   Specimen Description BLOOD LEFT WRIST  Final   Special Requests   Final    BOTTLES DRAWN AEROBIC ONLY Blood Culture results may not be optimal due to an inadequate volume of blood received in culture bottles   Culture   Final    NO GROWTH 2 DAYS Performed at [facility], [address], [place], [place] [address]    Report Status PENDING  Incomplete  Culture, blood (routine x 2)     Status: None (Preliminary result)   Collection Time: 09/03/19  6:50 PM   Specimen: BLOOD LEFT ARM  Result Value Ref Range Status   Specimen Description BLOOD LEFT ARM  Final   Special Requests   Final    BOTTLES DRAWN AEROBIC AND ANAEROBIC Blood Culture adequate volume   Culture   Final    NO GROWTH 2 DAYS Performed at [facility], [address], [place], [place] [address]    Report Status PENDING  Incomplete      Radiology Studies: Dg Chest 2 View  Result Date: 09/04/2019 CLINICAL DATA:  Wound infection EXAM: CHEST - 2 VIEW COMPARISON:  September 02, 2019 FINDINGS: There is slight atelectasis in the right mid lung. The lungs elsewhere are clear. Heart is mildly enlarged with pulmonary vascularity. No adenopathy. Central catheter tip is at the cavoatrial junction. No pneumothorax. No bone lesions. IMPRESSION: Mild cardiomegaly. Pulmonary vascularity normal. No adenopathy. Mild atelectasis right  mid lung. No edema or consolidation. Electronically Signed   By: Bretta Bang III M.D.   On: 09/04/2019 08:57   Dg Abd 1 View  Result Date: 09/04/2019 CLINICAL DATA:  Wound infection and constipation EXAM: ABDOMEN - 1 VIEW COMPARISON:  None. FINDINGS: There is diffuse stool throughout colon. There is no bowel dilatation or air-fluid level to suggest bowel obstruction. No free air. There is a 2 mm calcification either in or overlying the medial upper left kidney. There are foci of iliac artery atherosclerosis bilaterally. IMPRESSION: Diffuse stool throughout colon, a finding indicative of constipation. No bowel obstruction or free  air. 2 mm calcification either in or overlying the medial upper pole left kidney. Electronically Signed   By: Bretta BangWilliam  Woodruff III M.D.   On: 09/04/2019 08:58      Scheduled Meds:  amLODipine  10 mg Oral Daily   aspirin EC  81 mg Oral Daily   carvedilol  12.5 mg Oral BID WC   Chlorhexidine Gluconate Cloth  6 each Topical Daily   docusate sodium  100 mg Oral BID   ferrous sulfate  325 mg Oral BID WC   gabapentin  800 mg Oral TID   heparin  5,000 Units Subcutaneous Q8H   insulin aspart  0-9 Units Subcutaneous TID WC   insulin glargine  15 Units Subcutaneous QHS   pantoprazole  40 mg Oral BID   senna  1 tablet Oral BID   sodium chloride flush  10-40 mL Intracatheter Q12H   sodium chloride flush  3 mL Intravenous Q12H   Continuous Infusions:  sodium chloride     sodium chloride 20 mL/hr at 09/04/19 1822   piperacillin-tazobactam (ZOSYN)  IV       LOS: 2 days      Time spent: 25 minutes   Noralee StainJennifer Otisha Spickler, DO [facility] 09/05/2019, 10:39 AM   Available via Epic secure chat 7am-7pm After these hours, please refer to coverage provider listed on [email or web address]

## 2019-09-05 NOTE — Progress Notes (Signed)
Patient left floor for endo at approx 1310. Patient returned to floor at approx 1430.   Paged DO Choi at [phone]. Informed BP 182/68, prior BP 196/71.BP's have been 180's-190's/60's-70's. Coreg 12.5mg  scheduled at 1630. Hydralazine ordered; per Maylene Roes ok to give Coreg early and in addition to hydralazine if meets parameters.   Paged DO Choi at [phone]. Please order for [facility] consult for right great toe amputation on 10/17 and left foot wound.  Paged DO Choi at [employer]. Informed pt. pain still hovering at 5/10 for right foot(amputated toe) & left lower abdomen w/activity; 5 mg oxy given

## 2019-09-06 ENCOUNTER — Encounter (HOSPITAL_COMMUNITY): Payer: Self-pay | Admitting: Cardiovascular Disease

## 2019-09-06 DIAGNOSIS — B952 Enterococcus as the cause of diseases classified elsewhere: Secondary | ICD-10-CM

## 2019-09-06 DIAGNOSIS — B9689 Other specified bacterial agents as the cause of diseases classified elsewhere: Secondary | ICD-10-CM

## 2019-09-06 DIAGNOSIS — Z95828 Presence of other vascular implants and grafts: Secondary | ICD-10-CM

## 2019-09-06 DIAGNOSIS — B9561 Methicillin susceptible Staphylococcus aureus infection as the cause of diseases classified elsewhere: Secondary | ICD-10-CM

## 2019-09-06 LAB — GLUCOSE, CAPILLARY
Glucose-Capillary: 82 mg/dL (ref 70–99)
Glucose-Capillary: 124 mg/dL — ABNORMAL HIGH (ref 70–99)
Glucose-Capillary: 172 mg/dL — ABNORMAL HIGH (ref 70–99)
Glucose-Capillary: 184 mg/dL — ABNORMAL HIGH (ref 70–99)

## 2019-09-06 LAB — MRSA PCR SCREENING: MRSA by PCR: NEGATIVE

## 2019-09-06 LAB — CBC
HCT: 28.3 % — ABNORMAL LOW (ref 36.0–46.0)
Hemoglobin: 9.1 g/dL — ABNORMAL LOW (ref 12.0–15.0)
MCH: 27.6 pg (ref 26.0–34.0)
MCHC: 32.2 g/dL (ref 30.0–36.0)
MCV: 85.8 fL (ref 80.0–100.0)
Platelets: 252 10*3/uL (ref 150–400)
RBC: 3.3 MIL/uL — ABNORMAL LOW (ref 3.87–5.11)
RDW: 17.2 % — ABNORMAL HIGH (ref 11.5–15.5)
WBC: 11.5 10*3/uL — ABNORMAL HIGH (ref 4.0–10.5)
nRBC: 0 % (ref 0.0–0.2)

## 2019-09-06 LAB — BASIC METABOLIC PANEL
Anion gap: 5 (ref 5–15)
BUN: 27 mg/dL — ABNORMAL HIGH (ref 6–20)
CO2: 23 mmol/L (ref 22–32)
Calcium: 7.9 mg/dL — ABNORMAL LOW (ref 8.9–10.3)
Chloride: 110 mmol/L (ref 98–111)
Creatinine, Ser: 1.97 mg/dL — ABNORMAL HIGH (ref 0.44–1.00)
GFR calc Af Amer: 36 mL/min — ABNORMAL LOW (ref >60)
GFR calc non Af Amer: 31 mL/min — ABNORMAL LOW (ref >60)
Glucose, Bld: 89 mg/dL (ref 70–99)
Potassium: 3.8 mmol/L (ref 3.5–5.1)
Sodium: 138 mmol/L (ref 135–145)

## 2019-09-06 MED ORDER — CARVEDILOL 25 MG PO TABS
25.0000 mg | ORAL_TABLET | Freq: Two times a day (BID) | ORAL | Status: DC
  Administered 2019-09-07 (×2): 25 mg via ORAL
  Filled 2019-09-06 (×3): qty 1

## 2019-09-06 MED ORDER — PIPERACILLIN-TAZOBACTAM IV (FOR PTA / DISCHARGE USE ONLY): 3.3750 g | Freq: Three times a day (TID) | INTRAVENOUS | 0 refills | Status: AC

## 2019-09-06 NOTE — Plan of Care (Signed)
  Problem: Activity: Goal: Capacity to carry out activities will improve Outcome: Adequate for Discharge   

## 2019-09-06 NOTE — Progress Notes (Signed)
Pt urinated, urine had a couple of red dots in it which patient says she's been having blood clots in her urine. Will continue to monitor.

## 2019-09-06 NOTE — Progress Notes (Signed)
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 PMhx: Dm, neuropathy, HTN, diastolic HF, CKD, Rt great toe amputation 08/27/19 with D/c to sNf 10/21    PT Comments    Pt tolerated treatment well, increasing ambulation tolerance and requiring less physical assistance during transfers. Pt continues to depend on BUE support for ambulation and standing balance, although pt is able to reduce WB through UE at times during static standing. Pt unable to progress to stair negotiation at this time 2/2 LE fatigue at end of session. Pt will benefit from continued PT services in acute setting and at SNF level upon discharge to improve dynamic balance, ambulation tolerance, and perform stair training.   Follow Up Recommendations  SNF     Equipment Recommendations  None recommended by PT    Recommendations for Other Services       Precautions / Restrictions Precautions Precautions: Fall Required Braces or Orthoses: Other Brace Other Brace: bil Darco shoes Restrictions Weight Bearing Restrictions: No    Mobility  Bed Mobility Overal bed mobility: Modified Independent                Transfers Overall transfer level: Needs assistance Equipment used: Rolling walker (2 wheeled) Transfers: Sit to/from Stand Sit to Stand: Min guard         General transfer comment: minG for safety and balance  Ambulation/Gait Ambulation/Gait assistance: Min guard Gait Distance (Feet): 125 Feet Assistive device: Rolling walker (2 wheeled) Gait Pattern/deviations: Step-through pattern;Shuffle Gait velocity: reduced Gait velocity interpretation: 1.31 - 2.62 ft/sec, indicative of limited community ambulator General Gait Details: pt with one minor LOB when bumping RW into siding of wall, able to self-correct  with stepping strategy   Stairs Stairs: (pt declining stair negotiation training 2/2 fatigue)           Wheelchair Mobility    Modified Rankin (Stroke Patients Only)       Balance Overall balance assessment: Needs assistance Sitting-balance support: No upper extremity supported;Feet supported Sitting balance-Leahy Scale: Good     Standing balance support: Bilateral upper extremity supported Standing balance-Leahy Scale: Fair Standing balance comment: minG for static standing balance, reliant on BUE                            Cognition Arousal/Alertness: Awake/alert Behavior During Therapy: WFL for tasks assessed/performed Overall Cognitive Status: Within Functional Limits for tasks assessed                                        Exercises      General Comments General comments (skin integrity, edema, etc.): pt with no noted increased work of breathing during activity, no reports of SOB      Pertinent Vitals/Pain Pain Assessment: Faces Faces Pain Scale: Hurts little more Pain Location: BLE Pain Descriptors / Indicators: Sore Pain Intervention(s): Limited activity within patient's tolerance    Home Living                      Prior Function            PT Goals (current goals can now be found in the care plan section) Progress towards PT goals: Progressing  toward goals    Frequency    Min 2X/week      PT Plan Current plan remains appropriate    Co-evaluation              AM-PAC PT "6 Clicks" Mobility   Outcome Measure  Help needed turning from your back to your side while in a flat bed without using bedrails?: None Help needed moving from lying on your back to sitting on the side of a flat bed without using bedrails?: None Help needed moving to and from a bed to a chair (including a wheelchair)?: A Little Help needed standing up from a chair using your arms (e.g., wheelchair or bedside chair)?: A  Little Help needed to walk in hospital room?: A Little Help needed climbing 3-5 steps with a railing? : A Lot 6 Click Score: 19    End of Session Equipment Utilized During Treatment: Other (comment)(BIL darco shoes) Activity Tolerance: Patient tolerated treatment well Patient left: in chair;with call bell/phone within reach;with chair alarm set Nurse Communication: Mobility status;Precautions PT Visit Diagnosis: Other abnormalities of gait and mobility (R26.89)     Time: 3785-8850 PT Time Calculation (min) (ACUTE ONLY): 17 min  Charges:  $Gait Training: 8-22 mins                     Zenaida Niece, PT, DPT Acute Rehabilitation Pager: [phone]  Zenaida Niece 09/06/2019, 4:36 PM

## 2019-09-06 NOTE — Progress Notes (Signed)
PHARMACY CONSULT NOTE FOR:  OUTPATIENT  PARENTERAL ANTIBIOTIC THERAPY (OPAT)  Indication: osteomyelitis Regimen: zosyn 3.375 IV q8h extended infusion End date: 09/25/2019  IV antibiotic discharge orders are pended. To discharging provider:  please sign these orders via discharge navigator,  Select New Orders & click on the button choice - Manage This Unsigned Work.     Thank you for allowing pharmacy to be a part of this patient's care.  Shawna Martinez 09/06/2019, 11:49 AM

## 2019-09-06 NOTE — Plan of Care (Signed)
  Problem: Education: Goal: Ability to demonstrate management of disease process will improve Outcome: Progressing   Problem: Cardiac: Goal: Ability to achieve and maintain adequate cardiopulmonary perfusion will improve Outcome: Progressing   

## 2019-09-06 NOTE — Consult Note (Signed)
[place] Nurse wound consult note Patient receiving care in [place].  Consult completed remotely after review of record and discussion via telephone with her primary RN. Reason for Consult: "wound care" Wound type: Diabetic foot ulcer, left plantar adjacent to great toe area. Wound bed: Per primary RN there is a pink wound bed surrounded by a heavy callus. Drainage (amount, consistency, odor) none Periwound: callus Dressing procedure/placement/frequency: Place xeroform gauze to ulcer on Left foot  plantar surface. Secure with kerlex. Monitor the wound area(s) for worsening of condition such as: Signs/symptoms of infection,  Increase in size,  Development of or worsening of odor, Development of pain, or increased pain at the affected locations.  Notify the medical team if any of these develop.  Thank you for the consult.  Discussed plan of care with the bedside nurse.  [place] nurse will not follow at this time.  Please re-consult the [place] team if needed.  Val Riles, RN, MSN, CWOCN, CNS-BC, pager [phone]

## 2019-09-06 NOTE — Progress Notes (Signed)
PROGRESS NOTE    Shawna Martinez  UJW:[id number] DOB: 01/24/1976 DOA: 09/03/2019 PCP: Galvin ProfferHague, Imran P, MD     Brief Narrative:  Shawna Chaletara Mcclane is a 43 y.o. female with past medical history significant for diabetes type 2 insulin-dependent, diabetic neuropathy, hypertension, diastolic heart failure, stage III chronic kidney disease prior creatinine levels 1.5-1.7 (05-2019), prior history of osteomyelitis of the right ankle and foot and recently underwent right great toe amputation (on October 17). Patient was recently hospitalized at [facility] for surgery and discharged to skilled nursing facility on 10/21.  She was discharged on IV Zosyn to treat underlying osteomyelitis with recommendation to complete 4 weeks of IV antibiotics.  Patient's home dose lasix was held at discharge due to AKI on chronic kidney disease. Patient [facility] department on 09/02/2019 with worsening shortness of breath and acute hypoxic respiratory failure requiring 4 L of oxygen.  Chest x-ray show pulmonary edema or multifocal pneumonia with possible small left pleural effusion.  CTA was negative for PE finding concerning with heart failure versus pneumonia.  During evaluation for heart failure exacerbation patient had 2D echo performed that showed preserved ejection fraction of 70%, possible small vegetation on the cusp of the aortic valve suggestive of endocarditis. Case was discussed with cardiology who recommended patient to be transferred to [facility] for definitive study, TEE. Patient was treated with IV Lasix 40 mg IV twice daily, with improvement of symptoms. IV Zosyn and IV vancomycin continued for presumed endocarditis.  Patient underwent TEE 10/26 which was negative for endocarditis.  New events last 24 hours / Subjective: Had some leg pain this morning, no complaints of shortness of breath today.  Feeling well overall.  Assessment & Plan:   Active Problems:   Respiratory failure,  acute (HCC)   HTN (hypertension)   CKD (chronic kidney disease), stage III   Osteomyelitis of right foot (HCC)   Type 2 diabetes mellitus with proliferative retinopathy (HCC)   Acute diastolic CHF (congestive heart failure) (HCC)   Anemia due to chronic kidney disease   Infective endocarditis   Endocarditis   Acute hypoxemic respiratory failure secondary to acute on chronic diastolic heart failure -Patient received IV Lasix at [facility] prior to transfer -Chest x-ray revealed mild cardiomegaly without edema or consolidation -IV Lasix further held due to worsening kidney function -Now on room air without respiratory distress  Possible aortic valve vegetation, infective endocarditis -Echocardiogram completed at [facility] revealed possible small vegetation on the cusp of the aortic valve suggestive of endocarditis -Blood cultures negative to date -TEE 10/26 ruled out endocarditis  AKI on CKD stage III -Baseline creatinine 1.5-1.79 -Creatinine at [facility] on 10/23 was 1.8 -Creatinine trending upward due to IV Lasix, vancomycin -Avoid nephrotoxin -Patient received one-time Lasix 10/25 due to blood transfusions -Creatinine improved overnight -Trend BMP  Type 2 diabetes, insulin-dependent, with neuropathy -Hemoglobin A1c 7.3 -Continue Lantus, sliding scale insulin -Continue gabapentin  Hypertension -Continue Norvasc, Coreg.  Lisinopril on hold due to AKI  Osteomyelitis of right foot, ankle with recent right great toe amputation -Patient had been scheduled for IV Zosyn through 11/15  Anemia of chronic kidney disease -Baseline hemoglobin around 7-7.6 -No signs of acute blood loss -Continue ferrous sulfate -Transfused 2 unit packed red blood cell 10/25.  Hemoglobin stable this morning   DVT prophylaxis: Subcutaneous heparin Code Status: Full code Family Communication: None at bedside Disposition Plan: Plan for discharge to SNF 10/28 as long as  creatinine stable and repeat  Covid test negative   Consultants:   Infectious disease called at the time of admission  Cardiology for TEE scheduling  Procedures:   TEE 10/26  Antimicrobials:  Anti-infectives (From admission, onward)   Start     Dose/Rate Route Frequency Ordered Stop   09/06/19 0000  piperacillin-tazobactam (ZOSYN) IVPB     3.375 g Intravenous Every 8 hours 09/06/19 1214 09/25/19 2359   09/05/19 2200  ceFAZolin (ANCEF) IVPB 2g/100 mL premix  Status:  Discontinued     2 g 200 mL/hr over 30 Minutes Intravenous Every 12 hours 09/05/19 0731 09/05/19 0957   09/05/19 1400  piperacillin-tazobactam (ZOSYN) IVPB 3.375 g     3.375 g 12.5 mL/hr over 240 Minutes Intravenous Every 8 hours 09/05/19 1005     09/04/19 1500  ceFAZolin (ANCEF) IVPB 2g/100 mL premix  Status:  Discontinued     2 g 200 mL/hr over 30 Minutes Intravenous Every 8 hours 09/04/19 1458 09/05/19 0731   09/04/19 0100  piperacillin-tazobactam (ZOSYN) IVPB 3.375 g  Status:  Discontinued     3.375 g 12.5 mL/hr over 240 Minutes Intravenous Every 8 hours 09/03/19 1746 09/04/19 1403   09/03/19 2014  vancomycin variable dose per unstable renal function (pharmacist dosing)  Status:  Discontinued      Does not apply See admin instructions 09/03/19 2014 09/04/19 1403   09/03/19 1830  vancomycin (VANCOCIN) 1,750 mg in sodium chloride 0.9 % 500 mL IVPB  Status:  Discontinued     1,750 mg 250 mL/hr over 120 Minutes Intravenous  Once 09/03/19 1746 09/03/19 2236   09/03/19 1800  piperacillin-tazobactam (ZOSYN) IVPB 3.375 g     3.375 g 100 mL/hr over 30 Minutes Intravenous  Once 09/03/19 1746 09/03/19 2335       Objective: Vitals:   09/06/19 0548 09/06/19 0834 09/06/19 1000 09/06/19 1123  BP: (!) 179/74 (!) 176/79 (!) 178/80 (!) 179/66  Pulse: 67 68 70 64  Resp: 16 18 16 18   Temp: 98.4 F (36.9 C) 98.2 F (36.8 C) 98 F (36.7 C) 98.5 F (36.9 C)  TempSrc: Oral Oral Oral Oral  SpO2: 94% 94% 94% 96%  Weight:  82.1 kg     Height:        Intake/Output Summary (Last 24 hours) at 09/06/2019 1244 Last data filed at 09/06/2019 1137 Gross per 24 hour  Intake 1607.39 ml  Output 4150 ml  Net -2542.61 ml   Filed Weights   09/04/19 0612 09/05/19 0455 09/06/19 0548  Weight: 85.4 kg 83.7 kg 82.1 kg    Examination: General exam: Appears calm and comfortable  Respiratory system: Clear to auscultation. Respiratory effort normal. Cardiovascular system: S1 & S2 heard, RRR. No pedal edema. Gastrointestinal system: Abdomen is nondistended, soft and nontender. Normal bowel sounds heard. Central nervous system: Alert and oriented. Non focal exam. Speech clear  Extremities: Bilateral lower extremity and feet in clean and dry dressing Psychiatry: Judgement and insight appear stable. Mood & affect appropriate.    Data Reviewed: I have personally reviewed following labs and imaging studies  CBC: Recent Labs  Lab 09/03/19 1848 09/04/19 0420 09/05/19 0327 09/06/19 0329  WBC 14.1* 12.2* 10.7* 11.5*  HGB 7.8* 6.7* 9.1* 9.1*  HCT 23.9* 21.1* 27.2* 28.3*  MCV 81.8 83.7 84.2 85.8  PLT 282 232 245 252   Basic Metabolic Panel: Recent Labs  Lab 09/03/19 1848 09/04/19 0420 09/05/19 0327 09/06/19 0329  NA 133* 134* 138 138  K 4.5 4.3 4.0 3.8  CL 102 103  107 110  CO2 19* 23 23 23   GLUCOSE 121* 133* 101* 89  BUN 38* 36* 36* 27*  CREATININE 2.50* 2.64* 2.79* 1.97*  CALCIUM 8.5* 7.9* 8.0* 7.9*   GFR: Estimated Creatinine Clearance: 39 mL/min (A) (by C-G formula based on SCr of 1.97 mg/dL (H)). Liver Function Tests: Recent Labs  Lab 09/03/19 1848  AST 16  ALT 13  ALKPHOS 84  BILITOT 0.4  PROT 7.1  ALBUMIN 2.7*   No results for input(s): LIPASE, AMYLASE in the last 168 hours. No results for input(s): AMMONIA in the last 168 hours. Coagulation Profile: No results for input(s): INR, PROTIME in the last 168 hours. Cardiac Enzymes: No results for input(s): CKTOTAL, CKMB, CKMBINDEX, TROPONINI in  the last 168 hours. BNP (last 3 results) No results for input(s): PROBNP in the last 8760 hours. HbA1C: Recent Labs    09/03/19 1848  HGBA1C 7.3*   CBG: Recent Labs  Lab 09/05/19 1344 09/05/19 1613 09/05/19 2128 09/06/19 0648 09/06/19 1121  GLUCAP 75 158* 151* 82 124*   Lipid Profile: No results for input(s): CHOL, HDL, LDLCALC, TRIG, CHOLHDL, LDLDIRECT in the last 72 hours. Thyroid Function Tests: No results for input(s): TSH, T4TOTAL, FREET4, T3FREE, THYROIDAB in the last 72 hours. Anemia Panel: Recent Labs    09/04/19 0420  VITAMINB12 1,141*  FOLATE 7.8  FERRITIN 57  TIBC 230*  IRON 32  RETICCTPCT 3.6*   Sepsis Labs: No results for input(s): PROCALCITON, LATICACIDVEN in the last 168 hours.  Recent Results (from the past 240 hour(s))  Culture, blood (routine x 2)     Status: None (Preliminary result)   Collection Time: 09/03/19  6:45 PM   Specimen: BLOOD LEFT WRIST  Result Value Ref Range Status   Specimen Description BLOOD LEFT WRIST  Final   Special Requests   Final    BOTTLES DRAWN AEROBIC ONLY Blood Culture results may not be optimal due to an inadequate volume of blood received in culture bottles   Culture   Final    NO GROWTH 3 DAYS Performed at [facility], [place] [address] [place], [place] [address]    Report Status PENDING  Incomplete  Culture, blood (routine x 2)     Status: None (Preliminary result)   Collection Time: 09/03/19  6:50 PM   Specimen: BLOOD LEFT ARM  Result Value Ref Range Status   Specimen Description BLOOD LEFT ARM  Final   Special Requests   Final    BOTTLES DRAWN AEROBIC AND ANAEROBIC Blood Culture adequate volume   Culture   Final    NO GROWTH 3 DAYS Performed at [facility], [place] [address] [place], [place] [address]    Report Status PENDING  Incomplete  MRSA PCR Screening     Status: None   Collection Time: 09/05/19 10:35 PM  Result Value Ref Range Status   MRSA by PCR NEGATIVE NEGATIVE Final    Comment:         The GeneXpert MRSA Assay (FDA approved for NASAL specimens only), is one component of a comprehensive MRSA colonization surveillance program. It is not intended to diagnose MRSA infection nor to guide or monitor treatment for MRSA infections. Performed at [facility], [place] [address] [place], [place] [address]       Radiology Studies: No results found.    Scheduled Meds: . amLODipine  10 mg Oral Daily  . aspirin EC  81 mg Oral Daily  . carvedilol  25  mg Oral BID WC  . Chlorhexidine Gluconate Cloth  6 each Topical Daily  . docusate sodium  100 mg Oral BID  . ferrous sulfate  325 mg Oral BID WC  . gabapentin  800 mg Oral TID  . heparin  5,000 Units Subcutaneous Q8H  . insulin aspart  0-9 Units Subcutaneous TID WC  . insulin glargine  15 Units Subcutaneous QHS  . pantoprazole  40 mg Oral BID  . senna  1 tablet Oral BID  . sodium chloride flush  10-40 mL Intracatheter Q12H  . sodium chloride flush  3 mL Intravenous Q12H   Continuous Infusions: . sodium chloride Stopped (09/05/19 2224)  . piperacillin-tazobactam (ZOSYN)  IV 3.375 g (09/06/19 0605)     LOS: 3 days      Time spent: 25 minutes   Noralee Stain, DO [facility] 09/06/2019, 12:44 PM   Available via Epic secure chat 7am-7pm After these hours, please refer to coverage provider listed on [email or web address]

## 2019-09-06 NOTE — Anesthesia Postprocedure Evaluation (Signed)
Anesthesia Post Note  Patient: Shawna Martinez  Procedure(s) Performed: TRANSESOPHAGEAL ECHOCARDIOGRAM (TEE) (N/A )     Patient location during evaluation: Endoscopy Anesthesia Type: MAC Level of consciousness: awake and alert Pain management: pain level controlled Vital Signs Assessment: post-procedure vital signs reviewed and stable Respiratory status: spontaneous breathing, nonlabored ventilation, respiratory function stable and patient connected to nasal cannula oxygen Cardiovascular status: blood pressure returned to baseline and stable Postop Assessment: no apparent nausea or vomiting Anesthetic complications: no    Last Vitals:  Vitals:   09/05/19 2039 09/06/19 0548  BP: (!) 171/66 (!) 179/74  Pulse: 67 67  Resp: 16 16  Temp: 36.9 C 36.9 C  SpO2: 93% 94%    Last Pain:  Vitals:   09/06/19 0555  TempSrc:   PainSc: [place]

## 2019-09-06 NOTE — Progress Notes (Signed)
[place] for Infectious Disease  Date of Admission:  09/03/2019      Total days of antibiotics   Zosyn started 10/17 after I&D          ASSESSMENT: Shawna Martinez is a 43 y.o. female with known polymicrobial osteomyelitis of the right foot now s/p I&D 10/17. There was concern for possible AV vegetation on transthoracic echo but TEE was unrevealing. Will continue previously outlined plan to continue zosyn via PICC to complete 4 weeks through November 15th. She has a follow up arranged with [facility] ID team on November 12th and would like to continue with that appointment.   Will sign off for now - happy to see her back should there be a change in condition or questions.    PLAN: 1. OPAT as outlined below.    OPAT ORDERS:  Diagnosis: osteomyelitis   Culture Result: polymicrobial (enterococcus, enterobacter cloacea, mssa)  Allergies  Allergen Reactions  . Amitriptyline Other (See Comments)    agitation  . Asenapine Anxiety and Other (See Comments)    Seizure-like episodes   . Atorvastatin Other (See Comments)    Inability to walk. Memory impairment.  . Carbamazepine Swelling and Other (See Comments)    Abnormal feelings.     . Nortriptyline Swelling  . Sulfa Antibiotics Nausea And Vomiting  . Sulfamethoxazole-Trimethoprim Nausea And Vomiting  . Sulfasalazine Nausea And Vomiting  . Statins Other (See Comments)    Affect motors skills.     Discharge antibiotics: Zosyn 3.375 g Q8h  Duration: 28 days   End Date: 09/25/19  [facility] Care and Maintenance Per Protocol (managed by [facility] ID Team) __ Please pull PIC at completion of IV antibiotics _x_ Please leave PIC in place until doctor has seen patient or been notified  Labs weekly while on IV antibiotics: _x_ CBC with differential _x_ BMP __ BMP TWICE WEEKLY** __ CMP __ CRP __ ESR __ Vancomycin trough  Fax weekly labs to [phone] (Fax)   Clinic Follow Up Appt: November 12th with [facility] ID      Active Problems:   Respiratory failure, acute ([place])   HTN (hypertension)   CKD (chronic kidney disease), stage III   Osteomyelitis of right foot ([place])   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 amLODipine  10 mg Oral Daily  . aspirin EC  81 mg Oral Daily  . carvedilol  25 mg Oral BID WC  . Chlorhexidine Gluconate Cloth  6 each Topical Daily  . docusate sodium  100 mg Oral BID  . ferrous sulfate  325 mg Oral BID WC  . gabapentin  800 mg Oral TID  . heparin  5,000 Units Subcutaneous Q8H  . insulin aspart  0-9 Units Subcutaneous TID WC  . insulin glargine  15 Units Subcutaneous QHS  . pantoprazole  40 mg Oral BID  . senna  1 tablet Oral BID  . sodium chloride flush  10-40 mL Intracatheter Q12H  . sodium chloride flush  3 mL Intravenous Q12H    SUBJECTIVE: She is so thankful her TEE is negative.  No new complaints. Plan for discharge to SNF.    Review of Systems: Review of Systems  Constitutional: Negative for chills, fever, malaise/fatigue and weight loss.  Respiratory: Negative for cough and sputum production.   Cardiovascular: Negative  for chest pain and leg swelling.  Gastrointestinal: Negative for abdominal pain, diarrhea and vomiting.  Genitourinary: Negative for dysuria and flank pain.  Musculoskeletal: Negative for joint pain, myalgias and neck pain.  Skin: Negative for rash.  Neurological: Negative for dizziness and headaches.    Allergies  Allergen Reactions  . Amitriptyline Other (See Comments)    agitation  . Asenapine Anxiety and Other (See Comments)    Seizure-like episodes   . Atorvastatin Other (See Comments)    Inability to walk. Memory impairment.  . Carbamazepine Swelling and Other (See Comments)    Abnormal feelings.     . Nortriptyline Swelling  . Sulfa Antibiotics Nausea And  Vomiting  . Sulfamethoxazole-Trimethoprim Nausea And Vomiting  . Sulfasalazine Nausea And Vomiting  . Statins Other (See Comments)    Affect motors skills.     OBJECTIVE: Vitals:   09/06/19 0548 09/06/19 0834 09/06/19 1000 09/06/19 1123  BP: (!) 179/74 (!) 176/79 (!) 178/80 (!) 179/66  Pulse: 67 68 70 64  Resp: 16 18 16 18   Temp: 98.4 F (36.9 C) 98.2 F (36.8 C) 98 F (36.7 C) 98.5 F (36.9 C)  TempSrc: Oral Oral Oral Oral  SpO2: 94% 94% 94% 96%  Weight: 82.1 kg     Height:       Body mass index is 31.07 kg/m.  Physical Exam Vitals signs and nursing note reviewed.  Constitutional:      Appearance: She is well-developed.     Comments: Seated comfortably in chair.   HENT:     Mouth/Throat:     Mouth: No oral lesions.     Dentition: Normal dentition. No dental abscesses.     Pharynx: No oropharyngeal exudate.  Cardiovascular:     Rate and Rhythm: Normal rate and regular rhythm.     Heart sounds: Normal heart sounds.  Pulmonary:     Effort: Pulmonary effort is normal.     Breath sounds: Normal breath sounds.  Abdominal:     General: There is no distension.     Palpations: Abdomen is soft.     Tenderness: There is no abdominal tenderness.  Lymphadenopathy:     Cervical: No cervical adenopathy.  Skin:    General: Skin is warm and dry.     Findings: No rash.     Comments: RUE PICC clean/dry and non-tender.   Neurological:     Mental Status: She is alert and oriented to person, place, and time.  Psychiatric:        Judgment: Judgment normal.     Lab Results Lab Results  Component Value Date   WBC 11.5 (H) 09/06/2019   HGB 9.1 (L) 09/06/2019   HCT 28.3 (L) 09/06/2019   MCV 85.8 09/06/2019   PLT 252 09/06/2019    Lab Results  Component Value Date   CREATININE 1.97 (H) 09/06/2019   BUN 27 (H) 09/06/2019   NA 138 09/06/2019   K 3.8 09/06/2019   CL 110 09/06/2019   CO2 23 09/06/2019    Lab Results  Component Value Date   ALT 13 09/03/2019   AST 16  09/03/2019   ALKPHOS 84 09/03/2019   BILITOT 0.4 09/03/2019     Microbiology: Recent Results (from the past 240 hour(s))  Culture, blood (routine x 2)     Status: None (Preliminary result)   Collection Time: 09/03/19  6:45 PM   Specimen: BLOOD LEFT WRIST  Result Value Ref Range Status   Specimen Description BLOOD LEFT WRIST  Final   Special Requests   Final    BOTTLES DRAWN AEROBIC ONLY Blood Culture results may not be optimal due to an inadequate volume of blood received in culture bottles   Culture   Final    NO GROWTH 3 DAYS Performed at [facility], [place] [address] [place], [place] [address]    Report Status PENDING  Incomplete  Culture, blood (routine x 2)     Status: None (Preliminary result)   Collection Time: 09/03/19  6:50 PM   Specimen: BLOOD LEFT ARM  Result Value Ref Range Status   Specimen Description BLOOD LEFT ARM  Final   Special Requests   Final    BOTTLES DRAWN AEROBIC AND ANAEROBIC Blood Culture adequate volume   Culture   Final    NO GROWTH 3 DAYS Performed at [facility], [place] [address], [place], [place] [address]    Report Status PENDING  Incomplete  MRSA PCR Screening     Status: None   Collection Time: 09/05/19 10:35 PM  Result Value Ref Range Status   MRSA by PCR NEGATIVE NEGATIVE Final    Comment:        The GeneXpert MRSA Assay (FDA approved for NASAL specimens only), is one component of a comprehensive MRSA colonization surveillance program. It is not intended to diagnose MRSA infection nor to guide or monitor treatment for MRSA infections. Performed at [facility], [place] [address] [place], [place] [address]      Janene Madeira, MSN, NP-C [place] for Infectious Disease [email or web address] Pager: [phone] Office: [phone] [place]: [phone]

## 2019-09-07 LAB — GLUCOSE, CAPILLARY
Glucose-Capillary: 79 mg/dL (ref 70–99)
Glucose-Capillary: 172 mg/dL — ABNORMAL HIGH (ref 70–99)
Glucose-Capillary: 180 mg/dL — ABNORMAL HIGH (ref 70–99)

## 2019-09-07 LAB — BASIC METABOLIC PANEL
Anion gap: 5 (ref 5–15)
BUN: 21 mg/dL — ABNORMAL HIGH (ref 6–20)
CO2: 24 mmol/L (ref 22–32)
Calcium: 8.3 mg/dL — ABNORMAL LOW (ref 8.9–10.3)
Chloride: 109 mmol/L (ref 98–111)
Creatinine, Ser: 1.75 mg/dL — ABNORMAL HIGH (ref 0.44–1.00)
GFR calc Af Amer: 41 mL/min — ABNORMAL LOW (ref >60)
GFR calc non Af Amer: 36 mL/min — ABNORMAL LOW (ref >60)
Glucose, Bld: 85 mg/dL (ref 70–99)
Potassium: 3.9 mmol/L (ref 3.5–5.1)
Sodium: 138 mmol/L (ref 135–145)

## 2019-09-07 LAB — CBC
HCT: 29.4 % — ABNORMAL LOW (ref 36.0–46.0)
Hemoglobin: 9.4 g/dL — ABNORMAL LOW (ref 12.0–15.0)
MCH: 27.1 pg (ref 26.0–34.0)
MCHC: 32 g/dL (ref 30.0–36.0)
MCV: 84.7 fL (ref 80.0–100.0)
Platelets: 242 10*3/uL (ref 150–400)
RBC: 3.47 MIL/uL — ABNORMAL LOW (ref 3.87–5.11)
RDW: 17.5 % — ABNORMAL HIGH (ref 11.5–15.5)
WBC: 10 10*3/uL (ref 4.0–10.5)
nRBC: 0 % (ref 0.0–0.2)

## 2019-09-07 LAB — NOVEL CORONAVIRUS, NAA (HOSP ORDER, SEND-OUT TO REF LAB; TAT 18-24 HRS): SARS-CoV-2, NAA: NOT DETECTED

## 2019-09-07 MED ORDER — FUROSEMIDE 20 MG PO TABS: 20.0000 mg | ORAL_TABLET | Freq: Every day | ORAL | 0 refills | Status: AC

## 2019-09-07 MED ORDER — OXYCODONE HCL 5 MG PO TABS: 5.0000 mg | ORAL_TABLET | Freq: Four times a day (QID) | ORAL | 0 refills | Status: AC | PRN

## 2019-09-07 MED ORDER — HYDRALAZINE HCL 20 MG/ML IJ SOLN
10.0000 mg | Freq: Once | INTRAMUSCULAR | Status: AC
  Administered 2019-09-07: 10 mg via INTRAVENOUS
  Filled 2019-09-07: qty 1

## 2019-09-07 NOTE — Progress Notes (Signed)
Microbiology called about the result of the patient covid's test but they said they will call me back.

## 2019-09-07 NOTE — Progress Notes (Signed)
Patients BP elevated despite PRN medication, provider notified.

## 2019-09-07 NOTE — Progress Notes (Signed)
Report given to nurse Aldona Bar at the facility. All questions answered to her satisfaction. Will give patient hydralazine PRN before patient leaves.

## 2019-09-07 NOTE — TOC Transition Note (Signed)
Transition of Care [facility]) - CM/SW Discharge Note   Patient Details  Name: Shawna Martinez MRN: [id number] Date of Birth: August 29, 1976  Transition of Care [facility]) CM/SW Contact:  Zenon Mayo, RN Phone Number: 09/07/2019, 5:18 PM   Clinical Narrative:    Patient for dc to [facility] SNF, ptar called scheduled pick up is at 4:30 pm,, patient going to room 202,   Staff RN to Call report to [phone].     Final next level of care: Skilled Nursing Facility Barriers to Discharge: No Barriers Identified   Patient Goals and CMS Choice Patient states their goals for this hospitalization and ongoing recovery are:: SNF CMS Medicare.gov Compare Post Acute Care list provided to:: Patient Choice offered to / list presented to : Patient  Discharge Placement              Patient [facility] bed at: [facility] Re[facility] Patient to be transferred to facility by: ptar Name of family member notified: Pamala Hurry simmons Patient and family notified of of transfer: 09/07/19  Discharge Plan and Services In-house Referral: NA Discharge Planning Services: CM Consult Post Acute Care Choice: [place]          DME Arranged: (NA)         HH Arranged: NA          Social Determinants of Health (SDOH) Interventions     Readmission Risk Interventions Readmission Risk Prevention Plan 09/05/2019  Transportation Screening Complete  PCP or Specialist Appt within 5-7 Days Complete  Home Care Screening Complete  Medication Review (RN CM) Complete  Some recent data might be hidden

## 2019-09-07 NOTE — Discharge Summary (Signed)
Physician Discharge Summary  Shawna Martinez GNO:[id number] DOB: 1976-11-05 DOA: 09/03/2019  PCP: Bonnita Nasuti, MD  Admit date: 09/03/2019 Discharge date: 09/07/2019  Admitted From: SNF Disposition:  SNF   Recommendations for Outpatient Follow-up:  1. Follow up with PCP in 1 week 2. Follow up with Infectious disease as scheduled on 11/12 3. Continue IV Zosyn through 11/15 4. Wound care: Place xeroform gauze to ulcer on Left foot  plantar surface. Secure with kerlex.  Discharge Condition: Stable CODE STATUS: Full code Diet recommendation: Carb modified diet  Brief/Interim Summary: Shawna Martinez a 43 y.o.femalewith past medical history significant for diabetes type 2 insulin-dependent, diabetic neuropathy, hypertension, diastolic heart failure, stage III chronic kidney disease prior creatinine levels 1.5-1.7 (05-2019),prior history of osteomyelitis of the right ankle and foot and recently underwent right great toe amputation(on October 17). Patient was recently hospitalized at [facility] for surgery and discharged to skilled nursing facility on 10/21.She was discharged on IV Zosyn to treat underlying osteomyelitis with recommendation to complete 4 weeks of IV antibiotics. Patient's home dose lasixwas held at discharge due to AKI on chronic kidney disease. Patient presented to [facility] emergency department on 09/02/2019 with worsening shortness of breath and acute hypoxic respiratory failure requiring 4 L of oxygen. Chest x-ray show pulmonary edema or multifocal pneumonia with possible small left pleural effusion. CTA was negative for PE finding concerning with heart failure versus pneumonia. During evaluation for heart failure exacerbation patient had 2D echo performed that showed preserved ejection fraction of 70%, possible small vegetation on the cuspof the aortic valve suggestive of endocarditis.Case was discussed with cardiology who recommended patient to be  transferred to [facility] for definitive study, TEE.Patient was treated with IV Lasix 40 mg IV twice daily, with improvement of symptoms. IV Zosyn and IV vancomycin continued for presumed endocarditis.  Patient underwent TEE 10/26 which was negative for endocarditis.  Lasix was held due to AKI.  Creatinine continue to improve off of IV Lasix.  Discharge Diagnoses:  Active Problems:   Respiratory failure, acute (HCC)   HTN (hypertension)   CKD (chronic kidney disease), stage III   Osteomyelitis of right foot (HCC)   Type 2 diabetes mellitus with proliferative retinopathy (HCC)   Acute diastolic CHF (congestive heart failure) (HCC)   Anemia due to chronic kidney disease   Infective endocarditis   Endocarditis   Acute hypoxemic respiratory failure secondary to acute on chronic diastolic heart failure -Patient received IV Lasix at [facility] prior to transfer -Chest x-ray revealed mild cardiomegaly without edema or consolidation -IV Lasix further held due to worsening kidney function -Now on room air without respiratory distress -Resume home p.o. Lasix on discharge  Possible aortic valve vegetation, infective endocarditis -Echocardiogram completed at [facility] revealed possible small vegetation on the cusp of the aortic valve suggestive of endocarditis -Blood cultures negative to date -TEE 10/26 ruled out endocarditis  AKI on CKD stage III -Baseline creatinine 1.5-1.79 -Creatinine at [facility] on 10/23 was 1.8 -Creatinine trending upward due to IV Lasix, vancomycin -Avoid nephrotoxin -Patient received one-time Lasix 10/25 due to blood transfusions -Creatinine now back to baseline, creatinine 1.75 on day of discharge  Type 2 diabetes, insulin-dependent, with neuropathy -Hemoglobin A1c 7.3 -Continue Lantus, sliding scale insulin -Continue gabapentin  Hypertension -Continue Norvasc, Coreg.  Resume lisinopril  Osteomyelitis of right foot, ankle with  recent right great toe amputation -Patient had been scheduled for IV Zosyn through 11/15 -Wound care   Anemia of chronic kidney disease -Baseline  hemoglobin around 7-7.6 -No signs of acute blood loss -Continue ferrous sulfate -Transfused 2 unit packed red blood cell 10/25.  Hemoglobin stable this morning -Hemoglobin 9.4 on day of discharge    Discharge Instructions  Discharge Instructions    Diet Carb Modified   Complete by: As directed    Discharge instructions   Complete by: As directed    You were cared for by a hospitalist during your hospital stay. If you have any questions about your discharge medications or the care you received while you were in the hospital after you are discharged, you can call the unit and ask to speak with the hospitalist on call if the hospitalist that took care of you is not available. Once you are discharged, your primary care physician will handle any further medical issues. Please note that NO REFILLS for any discharge medications will be authorized once you are discharged, as it is imperative that you return to your primary care physician (or establish a relationship with a primary care physician if you do not have one) for your aftercare needs so that they can reassess your need for medications and monitor your lab values.   Home infusion instructions [facility] May follow [place] Dosing Protocol; May administer Cathflo as needed to maintain patency of vascular access device.; Flushing of vascular access device: per [facility] Protocol: 0.9% NaCl pre/post medica...   Complete by: As directed    Instructions: May follow [place] Dosing Protocol   Instructions: May administer Cathflo as needed to maintain patency of vascular access device.   Instructions: Flushing of vascular access device: per [facility] Protocol: 0.9% NaCl pre/post medication administration and prn patency; Heparin 100 u/ml, 45m for implanted ports and Heparin 10u/ml, 5104mfor all  other central venous catheters.   Instructions: May follow [facility] Anaphylaxis Protocol for First Dose Administration in the home: 0.9% NaCl at 25-50 ml/hr to maintain IV access for protocol meds. Epinephrine 0.3 ml IV/IM PRN and Benadryl 25-50 IV/IM PRN s/s of anaphylaxis.   Instructions: AdDoughertynfusion Coordinator (RN) to assist per patient IV care needs in the home PRN.   Increase activity slowly   Complete by: As directed      Allergies as of 09/07/2019      Reactions   Amitriptyline Other (See Comments)   agitation   Asenapine Anxiety, Other (See Comments)   Seizure-like episodes   Atorvastatin Other (See Comments)   Inability to walk. Memory impairment.   Carbamazepine Swelling, Other (See Comments)   Abnormal feelings.    Nortriptyline Swelling   Sulfa Antibiotics Nausea And Vomiting   Sulfamethoxazole-trimethoprim Nausea And Vomiting   Sulfasalazine Nausea And Vomiting   Statins Other (See Comments)   Affect motors skills.       Medication List    TAKE these medications   acetaminophen 500 MG tablet Commonly known as: TYLENOL Take 1,000 mg by mouth every 6 (six) hours as needed for headache (pain).   Admelog 100 UNIT/ML injection Generic drug: insulin lispro Inject 0-10 Units into the skin 3 (three) times daily before meals. CBG 0-150 0 units, 151-200 2 units, 201-250 4 units, 251-300 6 units, 301-350 8 units, 351-400 10 units, >400 call MD   amLODipine 10 MG tablet Commonly known as: NORVASC Take 10 mg by mouth daily.   aspirin EC 81 MG tablet Take 81 mg by mouth daily.   Basaglar KwikPen 100 UNIT/ML Sopn Inject 15 Units into the skin at bedtime.   carvedilol  25 MG tablet Commonly known as: COREG Take 25 mg by mouth 2 (two) times daily with a meal.   ferrous sulfate 325 (65 FE) MG tablet Take 325 mg by mouth daily with breakfast.   furosemide 20 MG tablet Commonly known as: Lasix Take 1 tablet (20 mg total) by mouth daily.   gabapentin 800  MG tablet Commonly known as: NEURONTIN Take 800 mg by mouth 3 (three) times daily.   lisinopril 20 MG tablet Commonly known as: ZESTRIL Take 20 mg by mouth daily.   ondansetron 4 MG tablet Commonly known as: ZOFRAN Take 4 mg by mouth every 8 (eight) hours as needed for nausea or vomiting.   oxyCODONE 5 MG immediate release tablet Commonly known as: Oxy IR/ROXICODONE Take 1 tablet (5 mg total) by mouth every 6 (six) hours as needed for moderate pain.   piperacillin-tazobactam  IVPB Commonly known as: ZOSYN Inject 3.375 g into the vein every 8 (eight) hours for 19 days. Indication:  Osteomyelitis  Last Day of Therapy:  09/25/2019 Labs - Once weekly:  CBC/D and BMP, Labs - Every other week:  ESR and CRP What changed:   how much to take  when to take this  additional instructions            Home Infusion Instuctions  (From admission, onward)         Start     Ordered   09/06/19 0000  Home infusion instructions [facility] May follow [place] Dosing Protocol; May administer Cathflo as needed to maintain patency of vascular access device.; Flushing of vascular access device: per [facility] Protocol: 0.9% NaCl pre/post medica...    Question Answer Comment  Instructions May follow [place] Dosing Protocol   Instructions May administer Cathflo as needed to maintain patency of vascular access device.   Instructions Flushing of vascular access device: per [facility] Protocol: 0.9% NaCl pre/post medication administration and prn patency; Heparin 100 u/ml, 106m for implanted ports and Heparin 10u/ml, 516mfor all other central venous catheters.   Instructions May follow [facility] Anaphylaxis Protocol for First Dose Administration in the home: 0.9% NaCl at 25-50 ml/hr to maintain IV access for protocol meds. Epinephrine 0.3 ml IV/IM PRN and Benadryl 25-50 IV/IM PRN s/s of anaphylaxis.   Instructions [facility] Infusion Coordinator (RN) to assist per patient IV care needs in the home  PRN.      09/06/19 1214         Follow-up Information    [place], ImRosalyn ChartersMD. Schedule an appointment as soon as possible for a visit in 1 week(s).   Specialty: Internal Medicine Contact information: [address] [place] [phone]      RaKnox SalivaNP. Go on 09/22/2019.   Specialty: Internal Medicine Contact information: [address] [address] [phone]        Allergies  Allergen Reactions  . Amitriptyline Other (See Comments)    agitation  . Asenapine Anxiety and Other (See Comments)    Seizure-like episodes   . Atorvastatin Other (See Comments)    Inability to walk. Memory impairment.  . Carbamazepine Swelling and Other (See Comments)    Abnormal feelings.     . Nortriptyline Swelling  . Sulfa Antibiotics Nausea And Vomiting  . Sulfamethoxazole-Trimethoprim Nausea And Vomiting  . Sulfasalazine Nausea And Vomiting  . Statins Other (See Comments)    Affect motors skills.     Consultations:  ID    Procedures/Studies: Dg Chest 2 View  Result Date: 09/04/2019 CLINICAL DATA:  Wound infection EXAM: CHEST - 2 VIEW COMPARISON:  September 02, 2019 FINDINGS: There is slight atelectasis in the right mid lung. The lungs elsewhere are clear. Heart is mildly enlarged with pulmonary vascularity. No adenopathy. Central catheter tip is at the cavoatrial junction. No pneumothorax. No bone lesions. IMPRESSION: Mild cardiomegaly. Pulmonary vascularity normal. No adenopathy. Mild atelectasis right mid lung. No edema or consolidation. Electronically Signed   By: Lowella Grip III M.D.   On: 09/04/2019 08:57   Dg Abd 1 View  Result Date: 09/04/2019 CLINICAL DATA:  Wound infection and constipation EXAM: ABDOMEN - 1 VIEW COMPARISON:  None. FINDINGS: There is diffuse stool throughout colon. There is no bowel dilatation or air-fluid level to suggest bowel obstruction. No free air. There is a 2 mm  calcification either in or overlying the medial upper left kidney. There are foci of iliac artery atherosclerosis bilaterally. IMPRESSION: Diffuse stool throughout colon, a finding indicative of constipation. No bowel obstruction or free air. 2 mm calcification either in or overlying the medial upper pole left kidney. Electronically Signed   By: Lowella Grip III M.D.   On: 09/04/2019 08:58    TEE  IMPRESSIONS    1. Left ventricular ejection fraction, by visual estimation, is 55 to 60%. The left ventricle has normal function. There is no left ventricular hypertrophy.  2. Global right ventricle has normal systolic function.The right ventricular size is normal. No increase in right ventricular wall thickness.  3. Left atrial size was normal.  4. No thrombi in the left atrial appendage.  5. Right atrial size was normal.  6. The mitral valve is normal in structure. Trace mitral valve regurgitation.  7. No trisuspid valve vegetation visualized.  8. The tricuspid valve is normal in structure. Tricuspid valve regurgitation is trivial.  9. The aortic valve is normal in structure. Aortic valve regurgitation was not visualized by color flow Doppler. 10. No pulmonic vegetation present. 11. The pulmonic valve was normal in structure. Pulmonic valve regurgitation is not visualized by color flow Doppler. 12. The atrial septum is grossly normal.   Discharge Exam: Vitals:   09/07/19 0634 09/07/19 0846  BP: (!) 177/66 (!) 187/67  Pulse: 68 72  Resp:    Temp:    SpO2:       General: Pt is alert, awake, not in acute distress Cardiovascular: RRR, S1/S2 +, no edema Respiratory: CTA bilaterally, no wheezing, no rhonchi, no respiratory distress, no conversational dyspnea  Abdominal: Soft, NT, ND, bowel sounds + Extremities: no edema, no cyanosis Psych: Normal mood and affect, stable judgement and insight     The results of significant diagnostics from this hospitalization (including imaging,  microbiology, ancillary and laboratory) are listed below for reference.     Microbiology: Recent Results (from the past 240 hour(s))  Culture, blood (routine x 2)     Status: None (Preliminary result)   Collection Time: 09/03/19  6:45 PM   Specimen: BLOOD LEFT WRIST  Result Value Ref Range Status   Specimen Description BLOOD LEFT WRIST  Final   Special Requests   Final    BOTTLES DRAWN AEROBIC ONLY Blood Culture results may not be optimal due to an inadequate volume of blood received in culture bottles   Culture   Final    NO GROWTH 3 DAYS Performed at [facility], [place] [address], [place], [place] [address]    Report  Status PENDING  Incomplete  Culture, blood (routine x 2)     Status: None (Preliminary result)   Collection Time: 09/03/19  6:50 PM   Specimen: BLOOD LEFT ARM  Result Value Ref Range Status   Specimen Description BLOOD LEFT ARM  Final   Special Requests   Final    BOTTLES DRAWN AEROBIC AND ANAEROBIC Blood Culture adequate volume   Culture   Final    NO GROWTH 3 DAYS Performed at [facility], [address] [place], [place] [address]    Report Status PENDING  Incomplete  MRSA PCR Screening     Status: None   Collection Time: 09/05/19 10:35 PM  Result Value Ref Range Status   MRSA by PCR NEGATIVE NEGATIVE Final    Comment:        The GeneXpert MRSA Assay (FDA approved for NASAL specimens only), is one component of a comprehensive MRSA colonization surveillance program. It is not intended to diagnose MRSA infection nor to guide or monitor treatment for MRSA infections. Performed at [facility], [place] [address] [place], [place] [address]      Labs: BNP (last 3 results) No results for input(s): BNP in the last 8760 hours. Basic Metabolic Panel: Recent Labs  Lab 09/03/19 1848 09/04/19 0420 09/05/19 0327 09/06/19 0329 09/07/19 0500  NA 133* 134* 138 138 138  K 4.5 4.3 4.0 3.8 3.9  CL 102 103 107 110 109  CO2 19* _0 GLUCOSE 121* 133* 101* 89 85  BUN 38* 36* 36* 27* 21*  CREATININE 2.50* 2.64* 2.79* 1.97* 1.75*  CALCIUM 8.5* 7.9* 8.0* 7.9* 8.3*   Liver Function Tests: Recent Labs  Lab 09/03/19 1848  AST 16  ALT 13  ALKPHOS 84  BILITOT 0.4  PROT 7.1  ALBUMIN 2.7*   No results for input(s): LIPASE, AMYLASE in the last 168 hours. No results for input(s): AMMONIA in the last 168 hours. CBC: Recent Labs  Lab 09/03/19 1848 09/04/19 0420 09/05/19 0327 09/06/19 0329 09/07/19 0500  WBC 14.1* 12.2* 10.7* 11.5* 10.0  HGB 7.8* 6.7* 9.1* 9.1* 9.4*  HCT 23.9* 21.1* 27.2* 28.3* 29.4*  MCV 81.8 83.7 84.2 85.8 84.7  PLT 282 232 245 252 242   Cardiac Enzymes: No results for input(s): CKTOTAL, CKMB, CKMBINDEX, TROPONINI in the last 168 hours. BNP: Invalid input(s): POCBNP CBG: Recent Labs  Lab 09/06/19 0648 09/06/19 1121 09/06/19 1646 09/06/19 2128 09/07/19 0539  GLUCAP 82 124* 172* 184* 79   D-Dimer No results for input(s): DDIMER in the last 72 hours. Hgb A1c No results for input(s): HGBA1C in the last 72 hours. Lipid Profile No results for input(s): CHOL, HDL, LDLCALC, TRIG, CHOLHDL, LDLDIRECT in the last 72 hours. Thyroid function studies No results for input(s): TSH, T4TOTAL, T3FREE, THYROIDAB in the last 72 hours.  Invalid input(s): FREET3 Anemia work up No results for input(s): VITAMINB12, FOLATE, FERRITIN, TIBC, IRON, RETICCTPCT in the last 72 hours. Urinalysis    Component Value Date/Time   COLORURINE YELLOW 09/04/2019 0112   APPEARANCEUR CLEAR 09/04/2019 0112   LABSPEC 1.015 09/04/2019 0112   PHURINE 6.0 09/04/2019 0112   GLUCOSEU 50 (A) 09/04/2019 0112   HGBUR SMALL (A) 09/04/2019 0112   BILIRUBINUR NEGATIVE 09/04/2019 0112   KETONESUR NEGATIVE 09/04/2019 0112   PROTEINUR 100 (A) 09/04/2019 0112   NITRITE NEGATIVE 09/04/2019 0112   LEUKOCYTESUR NEGATIVE 09/04/2019 0112   Sepsis Labs Invalid input(s): PROCALCITONIN,  WBC,  LACTICIDVEN Microbiology Recent  Results (from the past 240 hour(s))  Culture, blood (routine x 2)     Status: None (Preliminary result)   Collection Time: 09/03/19  6:45 PM   Specimen: BLOOD LEFT WRIST  Result Value Ref Range Status   Specimen Description BLOOD LEFT WRIST  Final   Special Requests   Final    BOTTLES DRAWN AEROBIC ONLY Blood Culture results may not be optimal due to an inadequate volume of blood received in culture bottles   Culture   Final    NO GROWTH 3 DAYS Performed at [facility], [place] [address] [place], [place] [address]    Report Status PENDING  Incomplete  Culture, blood (routine x 2)     Status: None (Preliminary result)   Collection Time: 09/03/19  6:50 PM   Specimen: BLOOD LEFT ARM  Result Value Ref Range Status   Specimen Description BLOOD LEFT ARM  Final   Special Requests   Final    BOTTLES DRAWN AEROBIC AND ANAEROBIC Blood Culture adequate volume   Culture   Final    NO GROWTH 3 DAYS Performed at [facility], [place] [address], [place], [place] [address]    Report Status PENDING  Incomplete  MRSA PCR Screening     Status: None   Collection Time: 09/05/19 10:35 PM  Result Value Ref Range Status   MRSA by PCR NEGATIVE NEGATIVE Final    Comment:        The GeneXpert MRSA Assay (FDA approved for NASAL specimens only), is one component of a comprehensive MRSA colonization surveillance program. It is not intended to diagnose MRSA infection nor to guide or monitor treatment for MRSA infections. Performed at [facility], [place] [address], [place], [place] [address]      Patient was seen and examined on the day of discharge and was found to be in stable condition. Time coordinating discharge: 35 minutes including assessment and coordination of care, as well as examination of the patient.   SIGNED:  Dessa Phi, DO [facility] 09/07/2019, 9:50 AM

## 2019-09-07 NOTE — Progress Notes (Signed)
Hilda Blades called to get a number for report,awaiting her note.

## 2019-09-08 LAB — CULTURE, BLOOD (ROUTINE X 2)
Culture: NO GROWTH
Culture: NO GROWTH
Special Requests: ADEQUATE

## 2020-02-09 DEATH — deceased

## 2020-09-24 IMAGING — CR DG CHEST 2V
2 series · 2 of 2 positions shown · non-contrast
Comparison: September 02, 2019

CLINICAL DATA: Wound infection

EXAM:
CHEST - 2 VIEW

[chest pa]
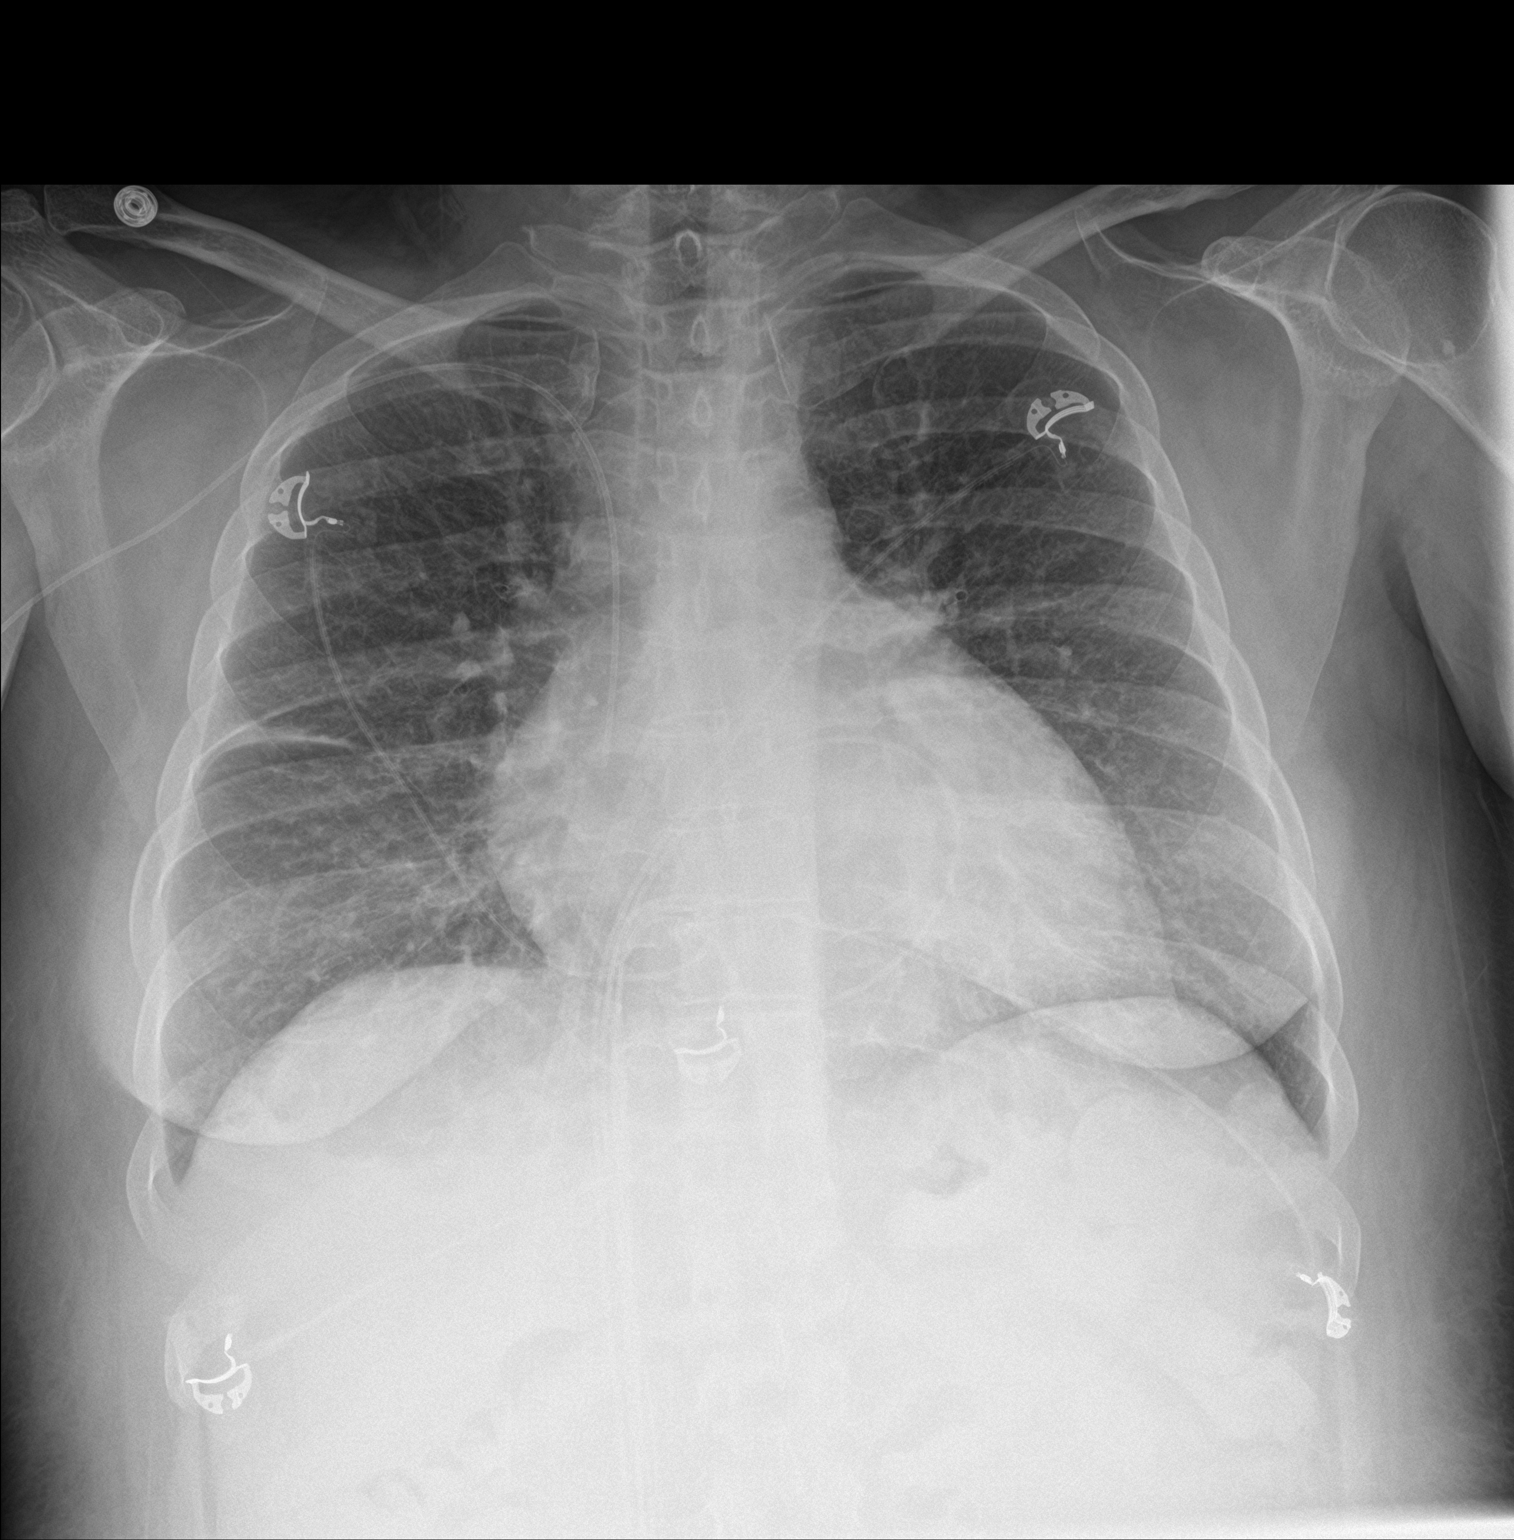

[chest lat]
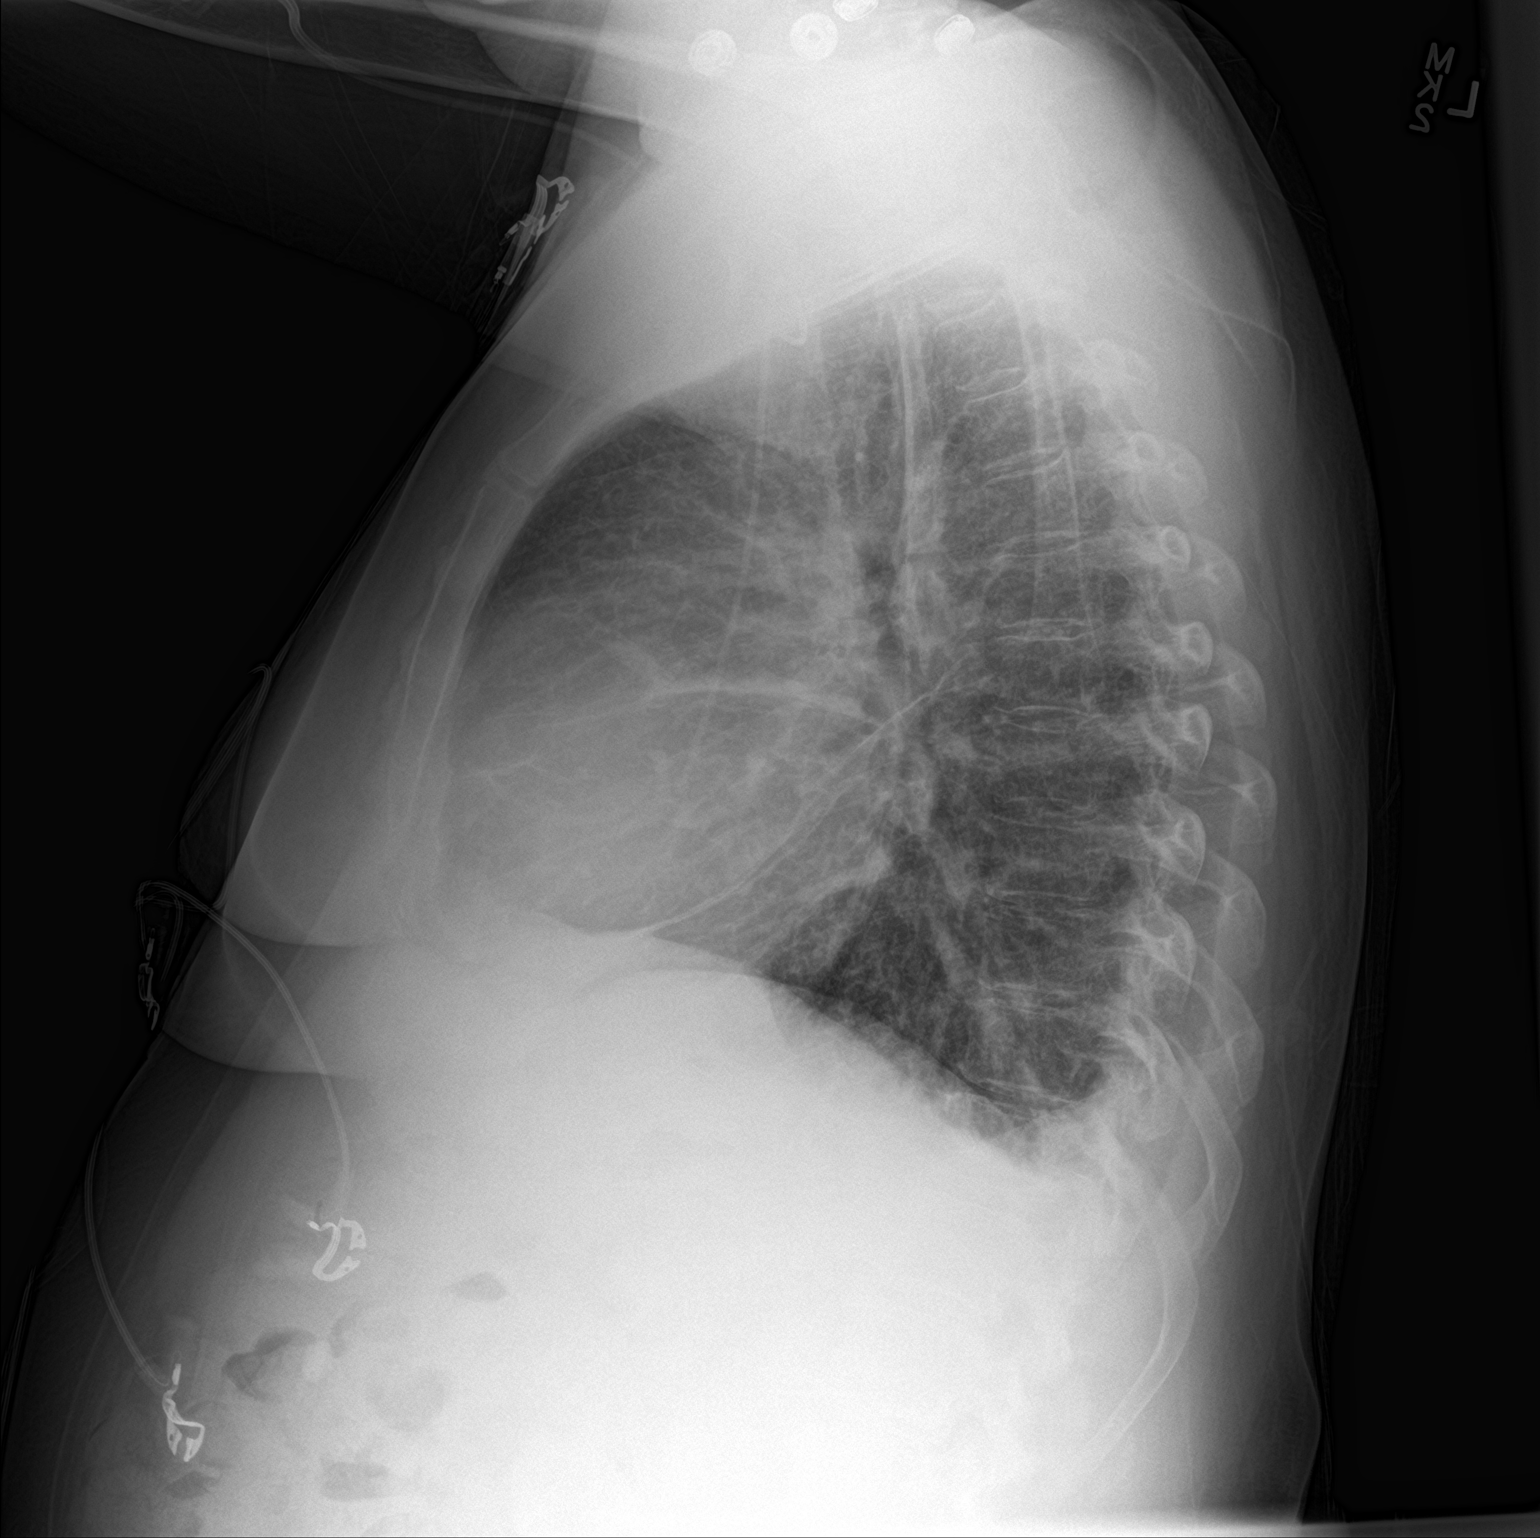

[2 of 2 positions shown; findings below may reference images not displayed]

FINDINGS: There is slight atelectasis in the right mid lung. The lungs
elsewhere are clear. Heart is mildly enlarged with pulmonary
vascularity. No adenopathy. Central catheter tip is at the
cavoatrial junction. No pneumothorax. No bone lesions.
IMPRESSION: Mild cardiomegaly. Pulmonary vascularity normal. No adenopathy. Mild
atelectasis right mid lung. No edema or consolidation.

## 2023-08-11 DEATH — deceased

## 6408-06-10 DEATH — deceased
